# Patient Record
Sex: Male | Born: 1990 | Race: Black or African American | Hispanic: No | Marital: Single | Smoking: Current every day smoker
Health system: Southern US, Community
[De-identification: ages and names within clinical notes are randomized; demographics above are authoritative.]

## PROBLEM LIST (undated history)

## (undated) HISTORY — PX: APPENDECTOMY: SHX54

## (undated) HISTORY — PX: SPLENECTOMY, TOTAL: SHX788

---
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
## 2019-08-01 ENCOUNTER — Encounter (HOSPITAL_BASED_OUTPATIENT_CLINIC_OR_DEPARTMENT_OTHER): Payer: Self-pay | Admitting: Emergency Medicine

## 2019-08-01 ENCOUNTER — Other Ambulatory Visit: Payer: Self-pay

## 2019-08-01 ENCOUNTER — Emergency Department (HOSPITAL_BASED_OUTPATIENT_CLINIC_OR_DEPARTMENT_OTHER): Payer: Self-pay

## 2019-08-01 ENCOUNTER — Inpatient Hospital Stay (HOSPITAL_BASED_OUTPATIENT_CLINIC_OR_DEPARTMENT_OTHER)
Admission: EM | Admit: 2019-08-01 | Discharge: 2019-08-06 | DRG: 442 | Disposition: A | Discharge status: Home / Self Care | Payer: Self-pay | Attending: Internal Medicine | Admitting: Internal Medicine

## 2019-08-01 DIAGNOSIS — F17200 Nicotine dependence, unspecified, uncomplicated: Secondary | ICD-10-CM | POA: Diagnosis present

## 2019-08-01 DIAGNOSIS — Z856 Personal history of leukemia: Secondary | ICD-10-CM

## 2019-08-01 DIAGNOSIS — X58XXXA Exposure to other specified factors, initial encounter: Secondary | ICD-10-CM | POA: Diagnosis present

## 2019-08-01 DIAGNOSIS — Z9081 Acquired absence of spleen: Secondary | ICD-10-CM

## 2019-08-01 DIAGNOSIS — F1220 Cannabis dependence, uncomplicated: Secondary | ICD-10-CM | POA: Diagnosis present

## 2019-08-01 DIAGNOSIS — B159 Hepatitis A without hepatic coma: Principal | ICD-10-CM | POA: Diagnosis present

## 2019-08-01 DIAGNOSIS — D693 Immune thrombocytopenic purpura: Secondary | ICD-10-CM | POA: Diagnosis present

## 2019-08-01 DIAGNOSIS — T380X5A Adverse effect of glucocorticoids and synthetic analogues, initial encounter: Secondary | ICD-10-CM | POA: Diagnosis not present

## 2019-08-01 DIAGNOSIS — D72828 Other elevated white blood cell count: Secondary | ICD-10-CM | POA: Diagnosis not present

## 2019-08-01 DIAGNOSIS — Z23 Encounter for immunization: Secondary | ICD-10-CM

## 2019-08-01 DIAGNOSIS — Z20822 Contact with and (suspected) exposure to covid-19: Secondary | ICD-10-CM | POA: Diagnosis present

## 2019-08-01 DIAGNOSIS — Z9049 Acquired absence of other specified parts of digestive tract: Secondary | ICD-10-CM

## 2019-08-01 DIAGNOSIS — S025XXA Fracture of tooth (traumatic), initial encounter for closed fracture: Secondary | ICD-10-CM | POA: Diagnosis present

## 2019-08-01 DIAGNOSIS — D696 Thrombocytopenia, unspecified: Secondary | ICD-10-CM

## 2019-08-01 DIAGNOSIS — R7989 Other specified abnormal findings of blood chemistry: Secondary | ICD-10-CM

## 2019-08-01 DIAGNOSIS — Z96662 Presence of left artificial ankle joint: Secondary | ICD-10-CM | POA: Diagnosis present

## 2019-08-01 DIAGNOSIS — B179 Acute viral hepatitis, unspecified: Secondary | ICD-10-CM

## 2019-08-01 DIAGNOSIS — Z789 Other specified health status: Secondary | ICD-10-CM

## 2019-08-01 DIAGNOSIS — Q8901 Asplenia (congenital): Secondary | ICD-10-CM

## 2019-08-01 DIAGNOSIS — E876 Hypokalemia: Secondary | ICD-10-CM | POA: Diagnosis present

## 2019-08-01 LAB — URINALYSIS, MICROSCOPIC (REFLEX)

## 2019-08-01 LAB — URINALYSIS, ROUTINE W REFLEX MICROSCOPIC
Glucose, UA: 100 mg/dL — AB
Ketones, ur: 15 mg/dL — AB
Leukocytes,Ua: NEGATIVE
Nitrite: NEGATIVE
Protein, ur: 100 mg/dL — AB
Specific Gravity, Urine: >1.03 — ABNORMAL HIGH (ref 1.005–1.030)
pH: 6 (ref 5.0–8.0)

## 2019-08-01 LAB — CBC WITH DIFFERENTIAL/PLATELET
Abs Immature Granulocytes: 0.01 10*3/uL (ref 0.00–0.07)
Basophils Absolute: 0 10*3/uL (ref 0.0–0.1)
Basophils Relative: 1 %
Eosinophils Absolute: 0 10*3/uL (ref 0.0–0.5)
Eosinophils Relative: 0 %
HCT: 42.6 % (ref 39.0–52.0)
Hemoglobin: 14.7 g/dL (ref 13.0–17.0)
Immature Granulocytes: 0 %
Lymphocytes Relative: 44 %
Lymphs Abs: 2.4 10*3/uL (ref 0.7–4.0)
MCH: 32.2 pg (ref 26.0–34.0)
MCHC: 34.5 g/dL (ref 30.0–36.0)
MCV: 93.4 fL (ref 80.0–100.0)
Monocytes Absolute: 1.2 10*3/uL — ABNORMAL HIGH (ref 0.1–1.0)
Monocytes Relative: 23 %
Neutro Abs: 1.7 10*3/uL (ref 1.7–7.7)
Neutrophils Relative %: 32 %
Platelets: DECREASED 10*3/uL (ref 150–400)
RBC: 4.56 MIL/uL (ref 4.22–5.81)
RDW: 12.1 % (ref 11.5–15.5)
WBC: 5.3 10*3/uL (ref 4.0–10.5)
nRBC: 0 % (ref 0.0–0.2)

## 2019-08-01 LAB — COMPREHENSIVE METABOLIC PANEL
ALT: 2201 U/L — ABNORMAL HIGH (ref 0–44)
AST: 2067 U/L — ABNORMAL HIGH (ref 15–41)
Albumin: 3.7 g/dL (ref 3.5–5.0)
Alkaline Phosphatase: 100 U/L (ref 38–126)
Anion gap: 8 (ref 5–15)
BUN: 8 mg/dL (ref 6–20)
CO2: 24 mmol/L (ref 22–32)
Calcium: 8.4 mg/dL — ABNORMAL LOW (ref 8.9–10.3)
Chloride: 102 mmol/L (ref 98–111)
Creatinine, Ser: 0.73 mg/dL (ref 0.61–1.24)
GFR calc Af Amer: >60 mL/min (ref >60)
GFR calc non Af Amer: >60 mL/min (ref >60)
Glucose, Bld: 94 mg/dL (ref 70–99)
Potassium: 3.3 mmol/L — ABNORMAL LOW (ref 3.5–5.1)
Sodium: 134 mmol/L — ABNORMAL LOW (ref 135–145)
Total Bilirubin: 4.1 mg/dL — ABNORMAL HIGH (ref 0.3–1.2)
Total Protein: 7 g/dL (ref 6.5–8.1)

## 2019-08-01 LAB — ACETAMINOPHEN LEVEL: Acetaminophen (Tylenol), Serum: <10 ug/mL — ABNORMAL LOW (ref 10–30)

## 2019-08-01 LAB — AMMONIA: Ammonia: 31 umol/L (ref 9–35)

## 2019-08-01 LAB — PROTIME-INR
INR: 1.5 — ABNORMAL HIGH (ref 0.8–1.2)
Prothrombin Time: 18.4 seconds — ABNORMAL HIGH (ref 11.4–15.2)

## 2019-08-01 LAB — LIPASE, BLOOD: Lipase: 26 U/L (ref 11–51)

## 2019-08-01 NOTE — ED Triage Notes (Signed)
abd pain and body aches x 3 days. Denies N/V
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
## 2019-08-01 NOTE — ED Notes (Signed)
Patient transported to Ultrasound 

## 2019-08-02 ENCOUNTER — Encounter (HOSPITAL_BASED_OUTPATIENT_CLINIC_OR_DEPARTMENT_OTHER): Payer: Self-pay | Admitting: Emergency Medicine

## 2019-08-02 DIAGNOSIS — B159 Hepatitis A without hepatic coma: Secondary | ICD-10-CM | POA: Diagnosis present

## 2019-08-02 DIAGNOSIS — Z789 Other specified health status: Secondary | ICD-10-CM

## 2019-08-02 DIAGNOSIS — Q8901 Asplenia (congenital): Secondary | ICD-10-CM

## 2019-08-02 DIAGNOSIS — F17200 Nicotine dependence, unspecified, uncomplicated: Secondary | ICD-10-CM

## 2019-08-02 DIAGNOSIS — F1220 Cannabis dependence, uncomplicated: Secondary | ICD-10-CM

## 2019-08-02 LAB — RAPID URINE DRUG SCREEN, HOSP PERFORMED
Amphetamines: NOT DETECTED
Barbiturates: NOT DETECTED
Benzodiazepines: NOT DETECTED
Cocaine: NOT DETECTED
Opiates: NOT DETECTED
Tetrahydrocannabinol: POSITIVE — AB

## 2019-08-02 LAB — CBC WITH DIFFERENTIAL/PLATELET
Abs Immature Granulocytes: 0.02 10*3/uL (ref 0.00–0.07)
Basophils Absolute: 0 10*3/uL (ref 0.0–0.1)
Basophils Relative: 1 %
Eosinophils Absolute: 0 10*3/uL (ref 0.0–0.5)
Eosinophils Relative: 0 %
HCT: 43.6 % (ref 39.0–52.0)
Hemoglobin: 15.2 g/dL (ref 13.0–17.0)
Immature Granulocytes: 0 %
Lymphocytes Relative: 41 %
Lymphs Abs: 2.5 10*3/uL (ref 0.7–4.0)
MCH: 32.6 pg (ref 26.0–34.0)
MCHC: 34.9 g/dL (ref 30.0–36.0)
MCV: 93.6 fL (ref 80.0–100.0)
Monocytes Absolute: 1.2 10*3/uL — ABNORMAL HIGH (ref 0.1–1.0)
Monocytes Relative: 19 %
Neutro Abs: 2.3 10*3/uL (ref 1.7–7.7)
Neutrophils Relative %: 39 %
Platelets: DECREASED 10*3/uL (ref 150–400)
RBC: 4.66 MIL/uL (ref 4.22–5.81)
RDW: 12 % (ref 11.5–15.5)
WBC: 6 10*3/uL (ref 4.0–10.5)
nRBC: 0 % (ref 0.0–0.2)

## 2019-08-02 LAB — URINALYSIS, ROUTINE W REFLEX MICROSCOPIC
Bacteria, UA: NONE SEEN
Glucose, UA: NEGATIVE mg/dL
Ketones, ur: 20 mg/dL — AB
Leukocytes,Ua: NEGATIVE
Nitrite: NEGATIVE
Protein, ur: 100 mg/dL — AB
Specific Gravity, Urine: 1.028 (ref 1.005–1.030)
pH: 6 (ref 5.0–8.0)

## 2019-08-02 LAB — HEPATITIS PANEL, ACUTE
HCV Ab: NONREACTIVE
Hep A IgM: REACTIVE — AB
Hep B C IgM: NONREACTIVE
Hepatitis B Surface Ag: NONREACTIVE

## 2019-08-02 LAB — SARS CORONAVIRUS 2 (TAT 6-24 HRS): SARS Coronavirus 2: NEGATIVE

## 2019-08-02 LAB — HIV ANTIBODY (ROUTINE TESTING W REFLEX): HIV Screen 4th Generation wRfx: NONREACTIVE

## 2019-08-02 LAB — RPR: RPR Ser Ql: NONREACTIVE

## 2019-08-02 MED ORDER — ONDANSETRON HCL 4 MG/2ML IJ SOLN
4.0000 mg | Freq: Four times a day (QID) | INTRAMUSCULAR | Status: DC | PRN
  Administered 2019-08-02: 4 mg via INTRAVENOUS
  Filled 2019-08-02: qty 2

## 2019-08-02 MED ORDER — DOCUSATE SODIUM 100 MG PO CAPS
100.0000 mg | ORAL_CAPSULE | Freq: Two times a day (BID) | ORAL | Status: DC
  Administered 2019-08-02 – 2019-08-04 (×5): 100 mg via ORAL
  Filled 2019-08-02 (×8): qty 1

## 2019-08-02 MED ORDER — PNEUMOCOCCAL VAC POLYVALENT 25 MCG/0.5ML IJ INJ
0.5000 mL | INJECTION | INTRAMUSCULAR | Status: AC
  Administered 2019-08-03: 0.5 mL via INTRAMUSCULAR
  Filled 2019-08-02 (×2): qty 0.5

## 2019-08-02 MED ORDER — MENINGOCOCCAL A C Y&W-135 OLIG IM SOLR
0.5000 mL | Freq: Once | INTRAMUSCULAR | Status: DC
  Filled 2019-08-02 (×2): qty 0.5

## 2019-08-02 MED ORDER — ONDANSETRON HCL 4 MG PO TABS: 4.0000 mg | ORAL_TABLET | Freq: Four times a day (QID) | ORAL | Status: DC | PRN

## 2019-08-02 MED ORDER — LACTATED RINGERS IV SOLN: INTRAVENOUS | Status: DC

## 2019-08-02 MED ORDER — SODIUM CHLORIDE 0.9 % IV SOLN
1.0000 g | Freq: Once | INTRAVENOUS | Status: AC
  Administered 2019-08-02: 08:00:00 1 g via INTRAVENOUS
  Filled 2019-08-02: qty 10

## 2019-08-02 MED ORDER — INFLUENZA VAC SPLIT QUAD 0.5 ML IM SUSY
0.5000 mL | PREFILLED_SYRINGE | INTRAMUSCULAR | Status: AC
  Administered 2019-08-03: 0.5 mL via INTRAMUSCULAR

## 2019-08-02 NOTE — ED Notes (Signed)
Patient states sweaty; gown and linens changed; denies pain or NV.

## 2019-08-02 NOTE — ED Notes (Signed)
Gave patient gatorade for po challenge.

## 2019-08-02 NOTE — ED Provider Notes (Signed)
Pt holding in [facility] ED pending admission for acute hepatitis, has hx/o splencetomy.  UA with many bacteria present, will send culture and treat with abx pending further work up. On bedside evaluation patient is sleeping comfortably in no acute distress.   Quintella Reichert, MD 08/02/19 [phone]
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
## 2019-08-03 LAB — CBC
HCT: 40.9 % (ref 39.0–52.0)
HCT: 41.3 % (ref 39.0–52.0)
Hemoglobin: 14.2 g/dL (ref 13.0–17.0)
Hemoglobin: 14.4 g/dL (ref 13.0–17.0)
MCH: 32.8 pg (ref 26.0–34.0)
MCH: 32.7 pg (ref 26.0–34.0)
MCHC: 34.7 g/dL (ref 30.0–36.0)
MCHC: 34.9 g/dL (ref 30.0–36.0)
MCV: 94.5 fL (ref 80.0–100.0)
MCV: 93.9 fL (ref 80.0–100.0)
Platelets: 19 10*3/uL — CL (ref 150–400)
Platelets: 30 10*3/uL — ABNORMAL LOW (ref 150–400)
RBC: 4.33 MIL/uL (ref 4.22–5.81)
RBC: 4.4 MIL/uL (ref 4.22–5.81)
RDW: 12.1 % (ref 11.5–15.5)
RDW: 12 % (ref 11.5–15.5)
WBC: 6.5 10*3/uL (ref 4.0–10.5)
WBC: 6.6 10*3/uL (ref 4.0–10.5)
nRBC: 0 % (ref 0.0–0.2)

## 2019-08-03 LAB — COMPREHENSIVE METABOLIC PANEL
ALT: 3206 U/L — ABNORMAL HIGH (ref 0–44)
AST: 2946 U/L — ABNORMAL HIGH (ref 15–41)
Albumin: 3.2 g/dL — ABNORMAL LOW (ref 3.5–5.0)
Alkaline Phosphatase: 95 U/L (ref 38–126)
Anion gap: 7 (ref 5–15)
BUN: 7 mg/dL (ref 6–20)
CO2: 26 mmol/L (ref 22–32)
Calcium: 8.4 mg/dL — ABNORMAL LOW (ref 8.9–10.3)
Chloride: 102 mmol/L (ref 98–111)
Creatinine, Ser: 0.62 mg/dL (ref 0.61–1.24)
GFR calc Af Amer: >60 mL/min (ref >60)
GFR calc non Af Amer: >60 mL/min (ref >60)
Glucose, Bld: 89 mg/dL (ref 70–99)
Potassium: 3.6 mmol/L (ref 3.5–5.1)
Sodium: 135 mmol/L (ref 135–145)
Total Bilirubin: 7.7 mg/dL — ABNORMAL HIGH (ref 0.3–1.2)
Total Protein: 6.3 g/dL — ABNORMAL LOW (ref 6.5–8.1)

## 2019-08-03 LAB — URINE CULTURE: Culture: <10000 — AB

## 2019-08-03 LAB — PATHOLOGIST SMEAR REVIEW

## 2019-08-03 MED ORDER — PREDNISONE 50 MG PO TABS
60.0000 mg | ORAL_TABLET | Freq: Every day | ORAL | Status: DC
  Administered 2019-08-04 – 2019-08-06 (×3): 60 mg via ORAL
  Filled 2019-08-03 (×3): qty 1

## 2019-08-03 NOTE — Progress Notes (Signed)
PROGRESS NOTE    Todd Marshall  [name] DOB: Oct 29, 1990 DOA: 08/01/2019 PCP: Patient, No Pcp Per   Brief Narrative: Todd Marshall is a 29 y.o. male with medical history significant of ITP requiring  splenectomy and childhood leukemia  presenting with abdominal pain x 3 days.   He reports that he ate something from [facility] and ever since  his stomach has been acting crazy.  He ate mozzarella sticks but he thought it tasted like the grease , he gagged immediately.  That was Monday.  He developed midepigastric abdominal pain that night and was constant until he went to the ER last night.  He was having cold sweats and having difficulty eating.  No sick contacts. Last BM was Monday.  He recently got out of [facility] from 8/5-12/5 for drugs. He had a tattoo done a week or two ago; his brother did the tattoo and did it in his home with his own equipment.  He smokes marijuana but no other drugs. He denies any active bleeding, He has not followed up with hematologist after his splenectomy.  He is found to have significantly elevated AST ALT consistent with hepatitis A infection.  Assessment & Plan:   Principal Problem:   Hepatitis A infection Active Problems:   Marijuana dependence ([place])   Tobacco dependence   History of incarceration   Asplenia  Hepatitis A Acute Infection: -Patient thinks he was infected after eating at the [employer] ([place], [place] [employer] -He developed acute n/v which resolved and then he had abdominal pain for several days -Markedly elevated LFTs -Treatment is generally supportive care only with IVF and monitoring to ensure spontaneous improvement. -Patient is complicated because he is asplenic, but there does not appear to be anything that we need to do differently in this circumstance -With his asplenia, he is at increased risk for encapsulated organism infections and so he should be vaccinated against Pneumococcus and  Meningococcus (flu shot also ordered as per protocol) -If LFTs begin to downtrend, he may be appropriate for d/c to home tomorrow (assuming he can tolerate PO) -Hep B/C, RPR, HIV all negative -Avoid Tylenol, other hepatotoxic medications.  H/o incarceration: -We discussed the importance of making better life choices -He specifically denies IVDA or other drug use   Marijuana dependence -Cessation encouraged; this should be encouraged on an ongoing basis -UDS + for marijuana.  Tobacco dependence -Encourage cessation.   -This was discussed with the patient and should be reviewed on an ongoing basis.   -Patch declined by patient  Clumped platelets/ Thrombocytopenia /ITP: -Last known platelet count is 87 in 2019 -Repeat CBC in AM shows platelets 19.0 -Case discussed with Dr. Benay Spice, he suggest repeat CBC in citrate bottle,  -Consider prednisone 60 mg daily for now. He is not actively bleeding, no need for platelet transfusion at this time. -Will hold Lovenox for now and encourage early ambulation   Note: This patient has been tested and is negative for the novel coronavirus COVID-19.  DVT prophylaxis: Early ambulation Code Status: Full code Family Communication: Discussed with patient in detail Disposition Plan: Remain inpatient Consultants:    Heme-onc consult  Procedures: None. Antimicrobials: Anti-infectives (From admission, onward)   Start     Dose/Rate Route Frequency Ordered Stop   08/02/19 0715  cefTRIAXone (ROCEPHIN) 1 g in sodium chloride 0.9 % 100 mL IVPB     1 g 200 mL/hr over 30 Minutes Intravenous  Once 08/02/19 0712 08/02/19 0902     Subjective:  Patient was seen and examined at bedside, He denies any bleeding.  States he has not followed up with hematologist after having the splenectomy.  He reports abdominal pain,  nausea and vomiting has resolved.   Objective: Vitals:   08/02/19 1141 08/02/19 1253 08/02/19 2018 08/03/19 0517  BP: 109/60 119/84  120/84 117/85  Pulse: 65 (!) 51 (!) 56 (!) 47  Resp: 16 16 16 16   Temp: 98.3 F (36.8 C) 98.6 F (37 C) 98.4 F (36.9 C) 98.6 F (37 C)  TempSrc: Oral Oral    SpO2: 100% 100% 100% 98%  Weight:      Height:        Intake/Output Summary (Last 24 hours) at 08/03/2019 1259 Last data filed at 08/03/2019 0905 Gross per 24 hour  Intake 2084.7 ml  Output 725 ml  Net 1359.7 ml   Filed Weights   08/01/19 1951  Weight: 72.6 kg    Examination:  General exam: Appears calm and comfortable  Respiratory system: Clear to auscultation. Respiratory effort normal. Cardiovascular system: S1 & S2 heard, RRR. No JVD, murmurs, rubs, gallops or clicks. No pedal edema. Gastrointestinal system: Abdomen is nondistended, soft and nontender. No organomegaly or masses felt. Normal bowel sounds heard. Central nervous system: Alert and oriented. No focal neurological deficits. Extremities: No swelling, no tenderness Skin: No rashes, lesions or ulcers Psychiatry: Judgement and insight appear normal. Mood & affect appropriate.   Data Reviewed: I have personally reviewed following labs and imaging studies  CBC: Recent Labs  Lab 08/01/19 2123 08/02/19 0018 08/03/19 0608  WBC 5.3 6.0 6.5  NEUTROABS 1.7 2.3  --   HGB 14.7 15.2 14.2  HCT 42.6 43.6 40.9  MCV 93.4 93.6 94.5  PLT PLATELET CLUMPS NOTED ON SMEAR, COUNT APPEARS DECREASED PLATELET CLUMPS NOTED ON SMEAR, COUNT APPEARS DECREASED 19*   Basic Metabolic Panel: Recent Labs  Lab 08/01/19 2018 08/03/19 0608  NA 134* 135  K 3.3* 3.6  CL 102 102  CO2 24 26  GLUCOSE 94 89  BUN 8 7  CREATININE 0.73 0.62  CALCIUM 8.4* 8.4*   GFR: Estimated Creatinine Clearance: 139.9 mL/min (by C-G formula based on SCr of 0.62 mg/dL). Liver Function Tests: Recent Labs  Lab 08/01/19 2018 08/03/19 0608  AST 2,067* 2,946*  ALT 2,201* 3,206*  ALKPHOS 100 95  BILITOT 4.1* 7.7*  PROT 7.0 6.3*  ALBUMIN 3.7 3.2*   Recent Labs  Lab 08/01/19 2228  LIPASE 26    Recent Labs  Lab 08/01/19 2228  AMMONIA 31   Coagulation Profile: Recent Labs  Lab 08/01/19 2228  INR 1.5*   Cardiac Enzymes: No results for input(s): CKTOTAL, CKMB, CKMBINDEX, TROPONINI in the last 168 hours. BNP (last 3 results) No results for input(s): PROBNP in the last 8760 hours. HbA1C: No results for input(s): HGBA1C in the last 72 hours. CBG: No results for input(s): GLUCAP in the last 168 hours. Lipid Profile: No results for input(s): CHOL, HDL, LDLCALC, TRIG, CHOLHDL, LDLDIRECT in the last 72 hours. Thyroid Function Tests: No results for input(s): TSH, T4TOTAL, FREET4, T3FREE, THYROIDAB in the last 72 hours. Anemia Panel: No results for input(s): VITAMINB12, FOLATE, FERRITIN, TIBC, IRON, RETICCTPCT in the last 72 hours. Sepsis Labs: No results for input(s): PROCALCITON, LATICACIDVEN in the last 168 hours.  Recent Results (from the past 240 hour(s))  Urine culture     Status: Abnormal   Collection Time: 08/01/19  8:19 PM   Specimen: Urine, Random  Result Value Ref Range Status  Specimen Description   Final    URINE, RANDOM Performed at [facility], [address] [place], [place] [address]    Special Requests   Final    NONE Performed at [facility], [address] [place], [place] [address]    Culture (A)  Final    <10,000 COLONIES/mL INSIGNIFICANT GROWTH Performed at [facility] [address] [place], [place] [address]    Report Status 08/03/2019 FINAL  Final  SARS CORONAVIRUS 2 (TAT 6-24 HRS) Nasopharyngeal Nasopharyngeal Swab     Status: None   Collection Time: 08/01/19  9:23 PM   Specimen: Nasopharyngeal Swab  Result Value Ref Range Status   SARS Coronavirus 2 NEGATIVE NEGATIVE Final    Comment: (NOTE) SARS-CoV-2 target nucleic acids are NOT DETECTED. The SARS-CoV-2 RNA is generally detectable in upper and lower respiratory specimens during the acute phase of infection. Negative results do not preclude  SARS-CoV-2 infection, do not rule out co-infections with other pathogens, and should not be used as the sole basis for treatment or other patient management decisions. Negative results must be combined with clinical observations, patient history, and epidemiological information. The expected result is Negative. Fact Sheet for Patients: SugarRoll.be Fact Sheet for Healthcare Providers: https://www.woods-mathews.com/ This test is not yet approved or cleared by the [place] FDA and  has been authorized for detection and/or diagnosis of SARS-CoV-2 by FDA under an Emergency Use Authorization (EUA). This EUA will remain  in effect (meaning this test can be used) for the duration of the COVID-19 declaration under Section 56 4(b)(1) of the Act, 21 U.S.C. section 360bbb-3(b)(1), unless the authorization is terminated or revoked sooner. Performed at [facility], [place] [address] [place], [place] [address]       Radiology Studies: US Abdomen Limited RUQ  Result Date: 08/01/2019 CLINICAL DATA:  Elevated LFTs EXAM: ULTRASOUND ABDOMEN LIMITED RIGHT UPPER QUADRANT COMPARISON:  None. FINDINGS: Gallbladder: No gallstones or wall thickening visualized. No sonographic Murphy sign noted by sonographer. Common bile duct: Diameter: 2.6 mm Liver: No focal lesion identified. Within normal limits in parenchymal echogenicity. Portal vein is patent on color Doppler imaging with normal direction of blood flow towards the liver. Other: Small 5 mm porta hepatis lymph node is noted. IMPRESSION: No acute abnormality noted. Electronically Signed   By: Inez Catalina M.D.   On: 08/01/2019 22:28   Scheduled Meds: . docusate sodium  100 mg Oral BID  . meningococcal oligosaccharide  0.5 mL Intramuscular Once   Continuous Infusions: . lactated ringers 125 mL/hr at 08/03/19 0905     LOS: 1 day    Time spent: [place], MD [facility]   If  7PM-7AM, please contact night-coverage

## 2019-08-04 LAB — COMPREHENSIVE METABOLIC PANEL
ALT: 3828 U/L — ABNORMAL HIGH (ref 0–44)
AST: 3464 U/L — ABNORMAL HIGH (ref 15–41)
Albumin: 3.2 g/dL — ABNORMAL LOW (ref 3.5–5.0)
Alkaline Phosphatase: 113 U/L (ref 38–126)
Anion gap: 8 (ref 5–15)
BUN: 6 mg/dL (ref 6–20)
CO2: 24 mmol/L (ref 22–32)
Calcium: 8.5 mg/dL — ABNORMAL LOW (ref 8.9–10.3)
Chloride: 102 mmol/L (ref 98–111)
Creatinine, Ser: 0.46 mg/dL — ABNORMAL LOW (ref 0.61–1.24)
GFR calc Af Amer: >60 mL/min (ref >60)
GFR calc non Af Amer: >60 mL/min (ref >60)
Glucose, Bld: 78 mg/dL (ref 70–99)
Potassium: 3.8 mmol/L (ref 3.5–5.1)
Sodium: 134 mmol/L — ABNORMAL LOW (ref 135–145)
Total Bilirubin: 9.9 mg/dL — ABNORMAL HIGH (ref 0.3–1.2)
Total Protein: 6.4 g/dL — ABNORMAL LOW (ref 6.5–8.1)

## 2019-08-04 LAB — PLATELET BY CITRATE

## 2019-08-04 LAB — CBC
HCT: 41.1 % (ref 39.0–52.0)
Hemoglobin: 14.1 g/dL (ref 13.0–17.0)
MCH: 32.3 pg (ref 26.0–34.0)
MCHC: 34.3 g/dL (ref 30.0–36.0)
MCV: 94.3 fL (ref 80.0–100.0)
RBC: 4.36 MIL/uL (ref 4.22–5.81)
RDW: 12.2 % (ref 11.5–15.5)
WBC: 9.3 10*3/uL (ref 4.0–10.5)
nRBC: 0 % (ref 0.0–0.2)

## 2019-08-04 LAB — PHOSPHORUS: Phosphorus: 2.5 mg/dL (ref 2.5–4.6)

## 2019-08-04 LAB — MAGNESIUM: Magnesium: 1.8 mg/dL (ref 1.7–2.4)

## 2019-08-04 MED ORDER — OXYCODONE HCL 5 MG PO TABS
5.0000 mg | ORAL_TABLET | Freq: Once | ORAL | Status: AC
  Administered 2019-08-04: 5 mg via ORAL
  Filled 2019-08-04: qty 1

## 2019-08-04 MED ORDER — ALUM & MAG HYDROXIDE-SIMETH 200-200-20 MG/5ML PO SUSP
30.0000 mL | Freq: Four times a day (QID) | ORAL | Status: DC | PRN
  Administered 2019-08-04: 30 mL via ORAL
  Filled 2019-08-04: qty 30

## 2019-08-04 NOTE — Progress Notes (Signed)
Due to pain in right shoulder as a result of PNA vaccine administration on 08/03/2019, patient refused to take MENVEO vaccine. Attending physician and pharmacy made aware.

## 2019-08-04 NOTE — Progress Notes (Signed)
PROGRESS NOTE    Todd Marshall  [name] DOB: Jul 21, 1991 DOA: 08/01/2019 PCP: Patient, No Pcp Per   Brief Narrative: Todd Marshall is a 29 y.o. male with medical history significant of ITP requiring  splenectomy and childhood leukemia  presenting with abdominal pain x 3 days.   He reports that he ate something from [facility] and ever since  his stomach has been acting crazy.  He ate mozzarella sticks but he thought it tasted like the grease , he gagged immediately.  That was Monday.  He developed midepigastric abdominal pain that night and was constant until he went to the ER last night.  He was having cold sweats and having difficulty eating.  No sick contacts. Last BM was Monday.  He recently got out of [facility] from 8/5-12/5 for drugs. He had a tattoo done a week or two ago; his brother did the tattoo and did it in his home with his own equipment.  He smokes marijuana but no other drugs. He denies any active bleeding, He has not followed up with hematologist after his splenectomy. He is found to have significantly elevated AST ALT consistent with hepatitis A infection. GI consulted for worsening Liver enzymes. Hematology suggest prednisone therapy.  Assessment & Plan:   Principal Problem:   Hepatitis A infection Active Problems:   Marijuana dependence ([place])   Tobacco dependence   History of incarceration   Asplenia  Hepatitis A Acute Infection: -Patient thinks he was infected after eating at the [facility] ([place], [place]) [employer] -He developed acute n/v which resolved and then he had abdominal pain for several days -Treatment is generally supportive care only with IVF and monitoring to ensure spontaneous improvement. -Patient is complicated because he is asplenic, but there does not appear to be anything that we need to do differently in this circumstance -With his asplenia, he is at increased risk for encapsulated organism infections and so he  should be vaccinated against Pneumococcus and Meningococcus (flu shot also ordered as per protocol) -If LFTs begin to downtrend, he may be appropriate for d/c to home tomorrow (assuming he can tolerate PO) -Hep B/C, RPR, HIV all negative -Avoid Tylenol, other hepatotoxic medications. -Markedly elevated LFTs, trending up  GI consulted, will follow up recommendations.  H/o incarceration: -We discussed the importance of making better life choices -He specifically denies IVDA or other drug use   Marijuana dependence -Cessation encouraged; this should be encouraged on an ongoing basis -UDS + for marijuana.  Tobacco dependence -Encourage cessation.   -This was discussed with the patient and should be reviewed on an ongoing basis.   -Patch declined by patient  Clumped platelets/ Thrombocytopenia /ITP: -Last known platelet count is 87 in 2019 -Repeat CBC in AM shows platelets 19.0 - 41 -Case discussed with Dr. Benay Spice, he suggest repeat CBC in citrate bottle,  -Consider prednisone 60 mg daily for now. He is not actively bleeding, no need for platelet transfusion at this time. -Will hold Lovenox for now and encourage early ambulation   Note: This patient has been tested and is negative for the novel coronavirus COVID-19.  DVT prophylaxis: Early ambulation Code Status: Full code Family Communication: Discussed with patient in detail Disposition Plan: Remain inpatient Consultants:    Heme-onc consult  Gastroenterology  Procedures: None. Antimicrobials: Anti-infectives (From admission, onward)   Start     Dose/Rate Route Frequency Ordered Stop   08/02/19 0715  cefTRIAXone (ROCEPHIN) 1 g in sodium chloride 0.9 % 100 mL IVPB  1 g 200 mL/hr over 30 Minutes Intravenous  Once 08/02/19 [id number] 08/02/19 0902     Subjective: Patient was seen and examined at bedside, He denies any bleeding.   He reports abdominal pain,  nausea and vomiting has resolved.   Objective: Vitals:    08/03/19 1440 08/03/19 2055 08/04/19 0535 08/04/19 1423  BP: 124/77 131/78 (!) 142/88 122/80  Pulse: (!) 51 (!) 53 63 (!) 55  Resp: 18 16 18 18   Temp: 98.6 F (37 C) 99 F (37.2 C) 99.5 F (37.5 C) 98.5 F (36.9 C)  TempSrc:  Oral Oral Oral  SpO2: 100% 100% 92% 99%  Weight:      Height:        Intake/Output Summary (Last 24 hours) at 08/04/2019 1434 Last data filed at 08/04/2019 0800 Gross per 24 hour  Intake --  Output 1125 ml  Net -1125 ml   Filed Weights   08/01/19 1951  Weight: 72.6 kg    Examination:  General exam: Appears calm and comfortable  Respiratory system: Clear to auscultation. Respiratory effort normal. Cardiovascular system: S1 & S2 heard, RRR. No JVD, murmurs, rubs, gallops or clicks. No pedal edema. Gastrointestinal system: Abdomen is nondistended, soft and nontender. No organomegaly or masses felt. Normal bowel sounds heard. Central nervous system: Alert and oriented. No focal neurological deficits. Extremities: No swelling, no tenderness Skin: No rashes, lesions or ulcers Psychiatry: Judgement and insight appear normal. Mood & affect appropriate.   Data Reviewed: I have personally reviewed following labs and imaging studies  CBC: Recent Labs  Lab 08/01/19 2123 08/02/19 0018 08/03/19 0608 08/03/19 0900 08/04/19 0559  WBC 5.3 6.0 6.5 6.6 9.3  NEUTROABS 1.7 2.3  --   --   --   HGB 14.7 15.2 14.2 14.4 14.1  HCT 42.6 43.6 40.9 41.3 41.1  MCV 93.4 93.6 94.5 93.9 94.3  PLT PLATELET CLUMPS NOTED ON SMEAR, COUNT APPEARS DECREASED PLATELET CLUMPS NOTED ON SMEAR, COUNT APPEARS DECREASED 19* 30* PLATELET COUNT PERFORMED ON CITRATED BLOOD   Basic Metabolic Panel: Recent Labs  Lab 08/01/19 2018 08/03/19 0608 08/04/19 0559  NA 134* 135 134*  K 3.3* 3.6 3.8  CL 102 102 102  CO2 24 26 24   GLUCOSE 94 89 78  BUN 8 7 6   CREATININE 0.73 0.62 0.46*  CALCIUM 8.4* 8.4* 8.5*  MG  --   --  1.8  PHOS  --   --  2.5   GFR: Estimated Creatinine Clearance:  139.9 mL/min (A) (by C-G formula based on SCr of 0.46 mg/dL (L)). Liver Function Tests: Recent Labs  Lab 08/01/19 2018 08/03/19 0608 08/04/19 0559  AST 2,067* 2,946* 3,464*  ALT 2,201* 3,206* 3,828*  ALKPHOS 100 95 113  BILITOT 4.1* 7.7* 9.9*  PROT 7.0 6.3* 6.4*  ALBUMIN 3.7 3.2* 3.2*   Recent Labs  Lab 08/01/19 2228  LIPASE 26   Recent Labs  Lab 08/01/19 2228  AMMONIA 31   Coagulation Profile: Recent Labs  Lab 08/01/19 2228  INR 1.5*   Cardiac Enzymes: No results for input(s): CKTOTAL, CKMB, CKMBINDEX, TROPONINI in the last 168 hours. BNP (last 3 results) No results for input(s): PROBNP in the last 8760 hours. HbA1C: No results for input(s): HGBA1C in the last 72 hours. CBG: No results for input(s): GLUCAP in the last 168 hours. Lipid Profile: No results for input(s): CHOL, HDL, LDLCALC, TRIG, CHOLHDL, LDLDIRECT in the last 72 hours. Thyroid Function Tests: No results for input(s): TSH, T4TOTAL, FREET4, T3FREE, THYROIDAB in  the last 72 hours. Anemia Panel: No results for input(s): VITAMINB12, FOLATE, FERRITIN, TIBC, IRON, RETICCTPCT in the last 72 hours. Sepsis Labs: No results for input(s): PROCALCITON, LATICACIDVEN in the last 168 hours.  Recent Results (from the past 240 hour(s))  Urine culture     Status: Abnormal   Collection Time: 08/01/19  8:19 PM   Specimen: Urine, Random  Result Value Ref Range Status   Specimen Description   Final    URINE, RANDOM Performed at [facility], [address] [place], [place] [address]    Special Requests   Final    NONE Performed at [facility], [address] [place], [place] [address]    Culture (A)  Final    <10,000 COLONIES/mL INSIGNIFICANT GROWTH Performed at [facility], [place] [address], [place], [place] [address]    Report Status 08/03/2019 FINAL  Final  SARS CORONAVIRUS 2 (TAT 6-24 HRS) Nasopharyngeal Nasopharyngeal Swab     Status: None   Collection Time: 08/01/19  9:23  PM   Specimen: Nasopharyngeal Swab  Result Value Ref Range Status   SARS Coronavirus 2 NEGATIVE NEGATIVE Final    Comment: (NOTE) SARS-CoV-2 target nucleic acids are NOT DETECTED. The SARS-CoV-2 RNA is generally detectable in upper and lower respiratory specimens during the acute phase of infection. Negative results do not preclude SARS-CoV-2 infection, do not rule out co-infections with other pathogens, and should not be used as the sole basis for treatment or other patient management decisions. Negative results must be combined with clinical observations, patient history, and epidemiological information. The expected result is Negative. Fact Sheet for Patients: SugarRoll.be Fact Sheet for Healthcare Providers: https://www.woods-mathews.com/ This test is not yet approved or cleared by the [place] FDA and  has been authorized for detection and/or diagnosis of SARS-CoV-2 by FDA under an Emergency Use Authorization (EUA). This EUA will remain  in effect (meaning this test can be used) for the duration of the COVID-19 declaration under Section 56 4(b)(1) of the Act, 21 U.S.C. section 360bbb-3(b)(1), unless the authorization is terminated or revoked sooner. Performed at [facility], [place] [address] [place], [place] [address]       Radiology Studies: No results found. Scheduled Meds: . docusate sodium  100 mg Oral BID  . meningococcal oligosaccharide  0.5 mL Intramuscular Once  . predniSONE  60 mg Oral Q breakfast   Continuous Infusions: . lactated ringers 125 mL/hr at 08/04/19 0911     LOS: 2 days    Time spent: [place], MD [facility]   If 7PM-7AM, please contact night-coverage

## 2019-08-04 NOTE — Consult Note (Signed)
Referring Provider: Dr. Dwyane Dee Primary Care Physician:  Patient, No Pcp Per Primary Gastroenterologist:  Althia Forts  Reason for Consultation:  Hepatitis A; Jaundice  HPI: Todd Marshall is a 29 y.o. male with newly diagnosed acute hepatitis A that he thinks he got from eating at [facility] the day before he started having abdominal pain, nausea/vomiting. He denies previous hepatitis infections. Hep A IgM Ab positive and Hep B and Hep C negative.  On admit (08/01/19) AST 2,067; ALT 2,201; TB 4.1, ALP 100, INR 1.5. LFTs have risen further to AST 3,464; ALT 3,828, TB 9.9, ALP 113.  History of ITP and previous splenectomy. Platelet count of 19. Denies alcohol in over a month. Occasional NSAIDs. Reports being incarcerated for 5 months until early December. Denies anal sex. Multiple tattoos all over his body. Mother at bedside.  Past Medical History:  Diagnosis Date  . Acute ITP ([place]) 2007  . Leukemia, acute ([place]) 2007   splenectomy    Past Surgical History:  Procedure Laterality Date  . ANKLE ARTHROPLASTY Left 2009  . APPENDECTOMY    . SPLENECTOMY, TOTAL      Prior to Admission medications   Medication Sig Start Date End Date Taking? Authorizing Provider  ibuprofen (ADVIL) 200 MG tablet Take 400 mg by mouth every 6 (six) hours as needed for moderate pain.   Yes [provider]    Scheduled Meds: . docusate sodium  100 mg Oral BID  . meningococcal oligosaccharide  0.5 mL Intramuscular Once  . predniSONE  60 mg Oral Q breakfast   Continuous Infusions: . lactated ringers 125 mL/hr at 08/04/19 0911   PRN Meds:.ondansetron **OR** ondansetron (ZOFRAN) IV  Allergies as of 08/01/2019  . (No Known Allergies)    History reviewed. No pertinent family history.  Social History   Socioeconomic History  . Marital status: Single    Spouse name: Not on file  . Number of children: Not on file  . Years of education: Not on file  . Highest education level: Not on file  Occupational  History  . Occupation: unemployed  Tobacco Use  . Smoking status: Current Every Day Smoker    Packs/day: 1.50    Years: 15.00    Pack years: 22.50  . Smokeless tobacco: Never Used  Substance and Sexual Activity  . Alcohol use: Yes    Comment: little use  . Drug use: Not Currently    Types: Marijuana    Comment: daily use  . Sexual activity: Not on file  Other Topics Concern  . Not on file  Social History Narrative  . Not on file   Social Determinants of Health   Financial Resource Strain:   . Difficulty of Paying Living Expenses: Not on file  Food Insecurity:   . Worried About Charity fundraiser in the Last Year: Not on file  . Ran Out of Food in the Last Year: Not on file  Transportation Needs:   . Lack of Transportation (Medical): Not on file  . Lack of Transportation (Non-Medical): Not on file  Physical Activity:   . Days of Exercise per Week: Not on file  . Minutes of Exercise per Session: Not on file  Stress:   . Feeling of Stress : Not on file  Social Connections:   . Frequency of Communication with Friends and Family: Not on file  . Frequency of Social Gatherings with Friends and Family: Not on file  . Attends Religious Services: Not on file  . Active  Member of Clubs or Organizations: Not on file  . Attends Archivist Meetings: Not on file  . Marital Status: Not on file  Intimate Partner Violence:   . Fear of Current or Ex-Partner: Not on file  . Emotionally Abused: Not on file  . Physically Abused: Not on file  . Sexually Abused: Not on file    Review of Systems: All negative except as stated above in HPI.  Physical Exam: Vital signs: Vitals:   08/04/19 0535 08/04/19 1423  BP: (!) 142/88 122/80  Pulse: 63 (!) 55  Resp: 18 18  Temp: 99.5 F (37.5 C) 98.5 F (36.9 C)  SpO2: 92% 99%   Last BM Date: 07/29/19 General:   Alert,  Well-developed, well-nourished, pleasant and cooperative in NAD, multiple tattoos Head: normocephalic,  atraumatic Eyes: +icteric sclera ENT: oropharynx clear Neck: supple, nontender Lungs:  Clear throughout to auscultation.   No wheezes, crackles, or rhonchi. No acute distress. Heart:  Regular rate and rhythm; no murmurs, clicks, rubs,  or gallops. Abdomen: minimal upper quadrant tenderness with guarding, otherwise nontender, soft, nondistended, +BS  Rectal:  Deferred Ext: no edema  GI:  Lab Results: Recent Labs    08/03/19 0608 08/03/19 0900 08/04/19 0559  WBC 6.5 6.6 9.3  HGB 14.2 14.4 14.1  HCT 40.9 41.3 41.1  PLT 19* 30* PLATELET COUNT PERFORMED ON CITRATED BLOOD   BMET Recent Labs    08/01/19 2018 08/03/19 0608 08/04/19 0559  NA 134* 135 134*  K 3.3* 3.6 3.8  CL 102 102 102  CO2 24 26 24   GLUCOSE 94 89 78  BUN 8 7 6   CREATININE 0.73 0.62 0.46*  CALCIUM 8.4* 8.4* 8.5*   LFT Recent Labs    08/04/19 0559  PROT 6.4*  ALBUMIN 3.2*  AST 3,464*  ALT 3,828*  ALKPHOS 113  BILITOT 9.9*   PT/INR Recent Labs    08/01/19 2228  LABPROT 18.4*  INR 1.5*     Studies/Results: No results found.  Impression/Plan: Acute Hepatitis A without any signs of liver failure. I suspect his transaminases will plateau in the next 24-48 hours and when they start to trend down then ok to d/c home from a liver standpoint. Hematology f/u may be needed with his low platelets. Discussed how Hep A is transmitted (fecal-oral route). Continue supportive care. [facility] will f/u tomorrow.    LOS: 2 days   Lear Ng  08/04/2019, 2:28 PM  Questions please call [phone]

## 2019-08-04 NOTE — Progress Notes (Signed)
IVF resumed after patient took a shower. GI physician and mom are at bedside.

## 2019-08-05 LAB — COMPREHENSIVE METABOLIC PANEL
ALT: 3139 U/L — ABNORMAL HIGH (ref 0–44)
AST: 1805 U/L — ABNORMAL HIGH (ref 15–41)
Albumin: 2.9 g/dL — ABNORMAL LOW (ref 3.5–5.0)
Alkaline Phosphatase: 112 U/L (ref 38–126)
Anion gap: 10 (ref 5–15)
BUN: 7 mg/dL (ref 6–20)
CO2: 25 mmol/L (ref 22–32)
Calcium: 8.2 mg/dL — ABNORMAL LOW (ref 8.9–10.3)
Chloride: 102 mmol/L (ref 98–111)
Creatinine, Ser: 0.42 mg/dL — ABNORMAL LOW (ref 0.61–1.24)
GFR calc Af Amer: >60 mL/min (ref >60)
GFR calc non Af Amer: >60 mL/min (ref >60)
Glucose, Bld: 88 mg/dL (ref 70–99)
Potassium: 3.8 mmol/L (ref 3.5–5.1)
Sodium: 137 mmol/L (ref 135–145)
Total Bilirubin: 11.3 mg/dL — ABNORMAL HIGH (ref 0.3–1.2)
Total Protein: 6.1 g/dL — ABNORMAL LOW (ref 6.5–8.1)

## 2019-08-05 LAB — CBC
HCT: 40.2 % (ref 39.0–52.0)
Hemoglobin: 13.9 g/dL (ref 13.0–17.0)
MCH: 33 pg (ref 26.0–34.0)
MCHC: 34.6 g/dL (ref 30.0–36.0)
MCV: 95.5 fL (ref 80.0–100.0)
Platelets: DECREASED 10*3/uL (ref 150–400)
RBC: 4.21 MIL/uL — ABNORMAL LOW (ref 4.22–5.81)
RDW: 12.4 % (ref 11.5–15.5)
WBC: 13.4 10*3/uL — ABNORMAL HIGH (ref 4.0–10.5)
nRBC: 0 % (ref 0.0–0.2)

## 2019-08-05 NOTE — Progress Notes (Signed)
PROGRESS NOTE    Todd Marshall  [name] DOB: Jan 14, 1991 DOA: 08/01/2019 PCP: Patient, No Pcp Per   Brief Narrative: Todd Marshall is a 29 y.o. male with medical history significant of ITP requiring  splenectomy and childhood leukemia  presenting with abdominal pain x 3 days.   He reports that he ate something from [facility] and ever since  his stomach has been acting crazy.  He ate mozzarella sticks but he thought it tasted like the grease , he gagged immediately.  That was Monday.  He developed midepigastric abdominal pain that night and was constant until he went to the ER last night.  He was having cold sweats and having difficulty eating.  No sick contacts. Last BM was Monday.  He recently got out of [facility] from 8/5-12/5 for drugs. He had a tattoo done a week or two ago; his brother did the tattoo and did it in his home with his own equipment.  He smokes marijuana but no other drugs. He denies any active bleeding, He has not followed up with hematologist after his splenectomy. He is found to have significantly elevated AST ALT consistent with hepatitis A infection. GI consulted for worsening Liver enzymes. Hematology suggest prednisone therapy.  Assessment & Plan:   Principal Problem:   Hepatitis A infection Active Problems:   Marijuana dependence ([place])   Tobacco dependence   History of incarceration   Asplenia  Hepatitis A Acute Infection: -Patient thinks he was infected after eating at the [employer] [facility], [place] [employer] -He developed acute n/v which resolved and then he had abdominal pain for several days -Treatment is generally supportive care only with IVF and monitoring to ensure spontaneous improvement. -Patient is complicated because he is asplenic, but there does not appear to be anything that we need to do differently in this circumstance -With his asplenia, he is at increased risk for encapsulated organism infections and so he  should be vaccinated against Pneumococcus and Meningococcus (flu shot also ordered as per protocol) -If LFTs begin to downtrend, he may be appropriate for d/c to home tomorrow (assuming he can tolerate PO) -Hep B/C, RPR, HIV all negative -Avoid Tylenol, other hepatotoxic medications. -Markedly elevated LFTs, trending up  GI consulted, will follow up recommendations.  H/o incarceration: -He specifically denies IVDA or other drug use   Marijuana dependence -Cessation encouraged; this should be encouraged on an ongoing basis -UDS + for marijuana.  Tobacco dependence -Encourage cessation.   -This was discussed with the patient and should be reviewed on an ongoing basis.   -Patch declined by patient  Clumped platelets/ Thrombocytopenia /ITP: -Last known platelet count is 87 in 2019 -Repeat CBC in AM shows platelets 19.0 - 41 -Case discussed with Dr. Benay Spice, he suggest repeat CBC in citrate bottle,  -Consider prednisone 60 mg daily for now. He is not actively bleeding, no need for platelet transfusion at this time. -Will hold Lovenox for now and encourage early ambulation  DVT prophylaxis: Early ambulation Code Status: Full code Family Communication: Discussed with patient in detail Disposition Plan: Likely home 08/06/2019 Consultants:    Heme-onc consult  Gastroenterology  Procedures: None. Antimicrobials: Anti-infectives (From admission, onward)   Start     Dose/Rate Route Frequency Ordered Stop   08/02/19 0715  cefTRIAXone (ROCEPHIN) 1 g in sodium chloride 0.9 % 100 mL IVPB     1 g 200 mL/hr over 30 Minutes Intravenous  Once 08/02/19 0712 08/02/19 0902     Subjective: No acute  complaint no nausea no vomiting no fever no chills.  No chest pain abdominal pain.   Objective: Vitals:   08/04/19 1423 08/04/19 2038 08/05/19 0537 08/05/19 1327  BP: 122/80 123/87 120/76 111/71  Pulse: (!) 55 (!) 49 (!) 49 (!) 46  Resp: 18 16 14 18   Temp: 98.5 F (36.9 C) 98.8 F (37.1  C) 98.5 F (36.9 C) 98.6 F (37 C)  TempSrc: Oral Oral Oral Oral  SpO2: 99% 99% 98% 100%  Weight:      Height:        Intake/Output Summary (Last 24 hours) at 08/05/2019 1716 Last data filed at 08/05/2019 1100 Gross per 24 hour  Intake 577.03 ml  Output 1075 ml  Net -497.97 ml   Filed Weights   08/01/19 1951  Weight: 72.6 kg    Examination:  General exam: Appears calm and comfortable  Respiratory system: Clear to auscultation. Respiratory effort normal. Cardiovascular system: S1 & S2 heard, RRR. No JVD, murmurs, rubs, gallops or clicks. No pedal edema. Gastrointestinal system: Abdomen is nondistended, soft and nontender. No organomegaly or masses felt. Normal bowel sounds heard. Central nervous system: Alert and oriented. No focal neurological deficits. Extremities: No swelling, no tenderness Skin: No rashes, lesions or ulcers.  Jaundiced. Psychiatry: Judgement and insight appear normal. Mood & affect appropriate.   Data Reviewed: I have personally reviewed following labs and imaging studies  CBC: Recent Labs  Lab 08/01/19 2123 08/02/19 0018 08/03/19 0608 08/03/19 0900 08/04/19 0559 08/05/19 0655  WBC 5.3 6.0 6.5 6.6 9.3 13.4*  NEUTROABS 1.7 2.3  --   --   --   --   HGB 14.7 15.2 14.2 14.4 14.1 13.9  HCT 42.6 43.6 40.9 41.3 41.1 40.2  MCV 93.4 93.6 94.5 93.9 94.3 95.5  PLT PLATELET CLUMPS NOTED ON SMEAR, COUNT APPEARS DECREASED PLATELET CLUMPS NOTED ON SMEAR, COUNT APPEARS DECREASED 19* 30* PLATELET COUNT PERFORMED ON CITRATED BLOOD PLATELET CLUMPS NOTED ON SMEAR, COUNT APPEARS DECREASED   Basic Metabolic Panel: Recent Labs  Lab 08/01/19 2018 08/03/19 0608 08/04/19 0559 08/05/19 0655  NA 134* 135 134* 137  K 3.3* 3.6 3.8 3.8  CL 102 102 102 102  CO2 24 26 24 25   GLUCOSE 94 89 78 88  BUN 8 7 6 7   CREATININE 0.73 0.62 0.46* 0.42*  CALCIUM 8.4* 8.4* 8.5* 8.2*  MG  --   --  1.8  --   PHOS  --   --  2.5  --    GFR: Estimated Creatinine Clearance: 139.9  mL/min (A) (by C-G formula based on SCr of 0.42 mg/dL (L)). Liver Function Tests: Recent Labs  Lab 08/01/19 2018 08/03/19 0608 08/04/19 0559 08/05/19 0655  AST 2,067* 2,946* 3,464* 1,805*  ALT 2,201* 3,206* 3,828* 3,139*  ALKPHOS 100 95 113 112  BILITOT 4.1* 7.7* 9.9* 11.3*  PROT 7.0 6.3* 6.4* 6.1*  ALBUMIN 3.7 3.2* 3.2* 2.9*   Recent Labs  Lab 08/01/19 2228  LIPASE 26   Recent Labs  Lab 08/01/19 2228  AMMONIA 31   Coagulation Profile: Recent Labs  Lab 08/01/19 2228  INR 1.5*   Cardiac Enzymes: No results for input(s): CKTOTAL, CKMB, CKMBINDEX, TROPONINI in the last 168 hours. BNP (last 3 results) No results for input(s): PROBNP in the last 8760 hours. HbA1C: No results for input(s): HGBA1C in the last 72 hours. CBG: No results for input(s): GLUCAP in the last 168 hours. Lipid Profile: No results for input(s): CHOL, HDL, LDLCALC, TRIG, CHOLHDL, LDLDIRECT in  the last 72 hours. Thyroid Function Tests: No results for input(s): TSH, T4TOTAL, FREET4, T3FREE, THYROIDAB in the last 72 hours. Anemia Panel: No results for input(s): VITAMINB12, FOLATE, FERRITIN, TIBC, IRON, RETICCTPCT in the last 72 hours. Sepsis Labs: No results for input(s): PROCALCITON, LATICACIDVEN in the last 168 hours.  Recent Results (from the past 240 hour(s))  Urine culture     Status: Abnormal   Collection Time: 08/01/19  8:19 PM   Specimen: Urine, Random  Result Value Ref Range Status   Specimen Description   Final    URINE, RANDOM Performed at [facility], [address] [place], [place] [address]    Special Requests   Final    NONE Performed at [facility], [address] [place], [place] [address]    Culture (A)  Final    <10,000 COLONIES/mL INSIGNIFICANT GROWTH Performed at [facility], [place] [address], [place], [place] [address]    Report Status 08/03/2019 FINAL  Final  SARS CORONAVIRUS 2 (TAT 6-24 HRS) Nasopharyngeal Nasopharyngeal Swab      Status: None   Collection Time: 08/01/19  9:23 PM   Specimen: Nasopharyngeal Swab  Result Value Ref Range Status   SARS Coronavirus 2 NEGATIVE NEGATIVE Final    Comment: (NOTE) SARS-CoV-2 target nucleic acids are NOT DETECTED. The SARS-CoV-2 RNA is generally detectable in upper and lower respiratory specimens during the acute phase of infection. Negative results do not preclude SARS-CoV-2 infection, do not rule out co-infections with other pathogens, and should not be used as the sole basis for treatment or other patient management decisions. Negative results must be combined with clinical observations, patient history, and epidemiological information. The expected result is Negative. Fact Sheet for Patients: SugarRoll.be Fact Sheet for Healthcare Providers: https://www.woods-mathews.com/ This test is not yet approved or cleared by the [place] FDA and  has been authorized for detection and/or diagnosis of SARS-CoV-2 by FDA under an Emergency Use Authorization (EUA). This EUA will remain  in effect (meaning this test can be used) for the duration of the COVID-19 declaration under Section 56 4(b)(1) of the Act, 21 U.S.C. section 360bbb-3(b)(1), unless the authorization is terminated or revoked sooner. Performed at [facility], [place] [address] [place], [place] [address]       Radiology Studies: No results found. Scheduled Meds: . docusate sodium  100 mg Oral BID  . meningococcal oligosaccharide  0.5 mL Intramuscular Once  . predniSONE  60 mg Oral Q breakfast   Continuous Infusions: . lactated ringers 125 mL/hr at 08/05/19 1150     LOS: 3 days    Time spent: [place], MD [facility]   If 7PM-7AM, please contact night-coverage

## 2019-08-05 NOTE — Plan of Care (Signed)
Transaminases downtrending, and bilirubin uptrending, typical pattern we see in patients recovering from acute liver injury.  CMP     Component Value Date/Time   NA 137 08/05/2019 0655   K 3.8 08/05/2019 0655   CL 102 08/05/2019 0655   CO2 25 08/05/2019 0655   GLUCOSE 88 08/05/2019 0655   BUN 7 08/05/2019 0655   CREATININE 0.42 (L) 08/05/2019 0655   CALCIUM 8.2 (L) 08/05/2019 0655   PROT 6.1 (L) 08/05/2019 0655   ALBUMIN 2.9 (L) 08/05/2019 0655   AST 1,805 (H) 08/05/2019 0655   ALT 3,139 (H) 08/05/2019 0655   ALKPHOS 112 08/05/2019 0655   BILITOT 11.3 (H) 08/05/2019 0655   GFRNONAA >60 08/05/2019 0655   GFRAA >60 08/05/2019 [id number]    Would monitor supportively; recheck LFTs (and check PT/INR) tomorrow.  No specific medical therapy indicated.  If LFTs continue to downtrend, and tomorrow mental status ok and PT/INR ok, could likely discharge home tomorrow from GI perspective.  [facility] will follow.

## 2019-08-05 NOTE — Plan of Care (Signed)
  Problem: Pain Managment: Goal: General experience of comfort will improve Outcome: Progressing   Problem: Safety: Goal: Ability to remain free from injury will improve Outcome: Progressing   Problem: Elimination: Goal: Will not experience complications related to bowel motility Outcome: Progressing   

## 2019-08-06 DIAGNOSIS — B159 Hepatitis A without hepatic coma: Principal | ICD-10-CM

## 2019-08-06 LAB — COMPREHENSIVE METABOLIC PANEL
ALT: 2262 U/L — ABNORMAL HIGH (ref 0–44)
AST: 824 U/L — ABNORMAL HIGH (ref 15–41)
Albumin: 2.9 g/dL — ABNORMAL LOW (ref 3.5–5.0)
Alkaline Phosphatase: 112 U/L (ref 38–126)
Anion gap: 7 (ref 5–15)
BUN: 6 mg/dL (ref 6–20)
CO2: 26 mmol/L (ref 22–32)
Calcium: 8.5 mg/dL — ABNORMAL LOW (ref 8.9–10.3)
Chloride: 104 mmol/L (ref 98–111)
Creatinine, Ser: 0.52 mg/dL — ABNORMAL LOW (ref 0.61–1.24)
GFR calc Af Amer: >60 mL/min (ref >60)
GFR calc non Af Amer: >60 mL/min (ref >60)
Glucose, Bld: 100 mg/dL — ABNORMAL HIGH (ref 70–99)
Potassium: 3.6 mmol/L (ref 3.5–5.1)
Sodium: 137 mmol/L (ref 135–145)
Total Bilirubin: 13.4 mg/dL — ABNORMAL HIGH (ref 0.3–1.2)
Total Protein: 6.4 g/dL — ABNORMAL LOW (ref 6.5–8.1)

## 2019-08-06 LAB — CBC WITH DIFFERENTIAL/PLATELET
Abs Immature Granulocytes: 0.14 10*3/uL — ABNORMAL HIGH (ref 0.00–0.07)
Basophils Absolute: 0.1 10*3/uL (ref 0.0–0.1)
Basophils Relative: 0 %
Eosinophils Absolute: 0 10*3/uL (ref 0.0–0.5)
Eosinophils Relative: 0 %
HCT: 38.4 % — ABNORMAL LOW (ref 39.0–52.0)
Hemoglobin: 13.1 g/dL (ref 13.0–17.0)
Immature Granulocytes: 1 %
Lymphocytes Relative: 28 %
Lymphs Abs: 4.8 10*3/uL — ABNORMAL HIGH (ref 0.7–4.0)
MCH: 32.5 pg (ref 26.0–34.0)
MCHC: 34.1 g/dL (ref 30.0–36.0)
MCV: 95.3 fL (ref 80.0–100.0)
Monocytes Absolute: 2.3 10*3/uL — ABNORMAL HIGH (ref 0.1–1.0)
Monocytes Relative: 14 %
Neutro Abs: 9.6 10*3/uL — ABNORMAL HIGH (ref 1.7–7.7)
Neutrophils Relative %: 57 %
Platelets: DECREASED 10*3/uL (ref 150–400)
RBC: 4.03 MIL/uL — ABNORMAL LOW (ref 4.22–5.81)
RDW: 12.6 % (ref 11.5–15.5)
WBC: 17 10*3/uL — ABNORMAL HIGH (ref 4.0–10.5)
nRBC: 0.2 % (ref 0.0–0.2)

## 2019-08-06 LAB — PROTIME-INR
INR: 1.3 — ABNORMAL HIGH (ref 0.8–1.2)
Prothrombin Time: 16.5 seconds — ABNORMAL HIGH (ref 11.4–15.2)

## 2019-08-06 LAB — PLATELET COUNT: Platelets: 69 10*3/uL — ABNORMAL LOW (ref 150–400)

## 2019-08-06 MED ORDER — PREDNISONE 20 MG PO TABS: 60.0000 mg | ORAL_TABLET | Freq: Every day | ORAL | 0 refills | Status: AC

## 2019-08-06 NOTE — Progress Notes (Signed)
Pt is being discharged home. Discharge instructions including follow up appointments and medications given. Pt had no further questions at this time. 

## 2019-08-06 NOTE — Progress Notes (Signed)
Patient now agreeable to PCP appointment. CSW arranged appointment for January 20th @3 :30pm. Patient reports he will make the appointment. Information written on AVS.

## 2019-08-06 NOTE — Discharge Summary (Signed)
Physician Discharge Summary  Todd Marshall [name] DOB: [birth year] DOA: 08/01/2019  PCP: Patient, No Pcp Per  Admit date: 08/01/2019 Discharge date: 08/06/2019  Admitted From: Home Disposition:  Home  Discharge Condition:Stable CODE STATUS:FULL Diet recommendation: Regular   Brief/Interim Summary:  Patient is a 29 year old male with history of ITP requiring splenectomy, history of  leukemia in childhood who presented with abdominal pain.  Found to have severely elevated liver enzymes.  Hepatitis panel was positive for hepatitis A infection.  GI consulted.  Liver enzymes slowly improving.  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  Started on prednisone.   Currently, his abdominal pain has resolved.  He is tolerating diet.  GI cleared him for discharge.  His liver enzymes, platelets level are improving.  He is hemodynamically stable for discharge to home today.  Following problems were addressed during his hospitalization:  Acute hepatitis A infection: Reported that he recently ate at [facility].  Presented with abdominal pain.  Hepatitis panel positive for hepatitis infection.  History of splenectomy due to history of ITP.  Vaccinated against pneumococcus. Liver enzymes slowly improving but bilirubin is high.  GI was following.  He is icteric.  Denies any abdominal pain today.  Tolerating diet.  No nausea, vomiting.  Hepatitis B/C, RPR, HIV negative.  Avoid hepatotoxic medications.   Check CMP in a week.  Follow-up with [facility] as an outpatient.  History of ITP:  He was following at [facility] hematology but has not followed with them since last several years..  Platelets counts were low, so started on prednisone.  CBC frequently shows clumped platelets.    Repeat platelets level this afternoon showed platelets level of 69. I have requested patient to follow-up with hematologist at our cancer center.  He will continue prednisone 60 mg daily for a week.  I have requested Dr.  Lindi Adie for arranging a follow-up appointment.  Marijuana/tobacco dependence: Counseled for cessation.  Leukocytosis: Most likely secondary to steroids.  Check CBC in a week.    Discharge Diagnoses:  Principal Problem:   Hepatitis A infection Active Problems:   Marijuana dependence ([place])   Tobacco dependence   History of incarceration   Asplenia    Discharge Instructions  Discharge Instructions    Diet general   Complete by: As directed    Discharge instructions   Complete by: As directed    1)Please follow up with a PCP in a week. Do a CMP and CBC during the follow up. 2)Please follow-up with gastroenterology in 4 weeks.  Name and number the provider has been attached.  You will be called for for appointment 3)Follow up with hematology as an outpatient for the management of your ITP.  You  will be called for appointment.   Increase activity slowly   Complete by: As directed      Allergies as of 08/06/2019   No Known Allergies     Medication List    TAKE these medications   Advil 200 MG tablet Generic drug: ibuprofen Take 400 mg by mouth every 6 (six) hours as needed for moderate pain.   predniSONE 20 MG tablet Commonly known as: DELTASONE Take 3 tablets (60 mg total) by mouth daily with breakfast for 7 days. Start taking on: August 07, 2019      Follow-up Information    Wilford Corner, MD. Schedule an appointment as soon as possible for a visit in 4 week(s).   Specialty: Gastroenterology Contact information: [id number]  [address] [place] [address] [phone]          No Known Allergies  Consultations:  GI   Procedures/Studies: US Abdomen Limited RUQ  Result Date: 08/01/2019 CLINICAL DATA:  Elevated LFTs EXAM: ULTRASOUND ABDOMEN LIMITED RIGHT UPPER QUADRANT COMPARISON:  None. FINDINGS: Gallbladder: No gallstones or wall thickening visualized. No sonographic Murphy sign noted by sonographer. Common bile duct: Diameter: 2.6 mm  Liver: No focal lesion identified. Within normal limits in parenchymal echogenicity. Portal vein is patent on color Doppler imaging with normal direction of blood flow towards the liver. Other: Small 5 mm porta hepatis lymph node is noted. IMPRESSION: No acute abnormality noted. Electronically Signed   By: Inez Catalina M.D.   On: 08/01/2019 22:28       Subjective: Patient seen and examined at the bedside this afternoon.  Hemodynamically stable for discharge today.  Discharge Exam: Vitals:   08/06/19 0529 08/06/19 1351  BP: 122/75 116/77  Pulse: 74 (!) 51  Resp: 16 18  Temp: 98.8 F (37.1 C) 98.8 F (37.1 C)  SpO2: 99% 98%   Vitals:   08/05/19 1327 08/05/19 2050 08/06/19 0529 08/06/19 1351  BP: 111/71 111/81 122/75 116/77  Pulse: (!) 46 63 74 (!) 51  Resp: 18 16 16 18   Temp: 98.6 F (37 C) 97.9 F (36.6 C) 98.8 F (37.1 C) 98.8 F (37.1 C)  TempSrc: Oral Oral Oral Oral  SpO2: 100% 99% 99% 98%  Weight:      Height:        General: Pt is alert, awake, not in acute distress Cardiovascular: RRR, S1/S2 +, no rubs, no gallops Respiratory: CTA bilaterally, no wheezing, no rhonchi Abdominal: Soft, NT, ND, bowel sounds + Extremities: no edema, no cyanosis    The results of significant diagnostics from this hospitalization (including imaging, microbiology, ancillary and laboratory) are listed below for reference.     Microbiology: Recent Results (from the past 240 hour(s))  Urine culture     Status: Abnormal   Collection Time: 08/01/19  8:19 PM   Specimen: Urine, Random  Result Value Ref Range Status   Specimen Description   Final    URINE, RANDOM Performed at [facility], [address] [place], [place] [address]    Special Requests   Final    NONE Performed at [facility], [address] [place], [place] [address]    Culture (A)  Final    <10,000 COLONIES/mL INSIGNIFICANT GROWTH Performed at [facility], [place] [address]  [place], [place] [address]    Report Status 08/03/2019 FINAL  Final  SARS CORONAVIRUS 2 (TAT 6-24 HRS) Nasopharyngeal Nasopharyngeal Swab     Status: None   Collection Time: 08/01/19  9:23 PM   Specimen: Nasopharyngeal Swab  Result Value Ref Range Status   SARS Coronavirus 2 NEGATIVE NEGATIVE Final    Comment: (NOTE) SARS-CoV-2 target nucleic acids are NOT DETECTED. The SARS-CoV-2 RNA is generally detectable in upper and lower respiratory specimens during the acute phase of infection. Negative results do not preclude SARS-CoV-2 infection, do not rule out co-infections with other pathogens, and should not be used as the sole basis for treatment or other patient management decisions. Negative results must be combined with clinical observations, patient history, and epidemiological information. The expected result is Negative. Fact Sheet for Patients: SugarRoll.be Fact Sheet for Healthcare Providers: https://www.woods-mathews.com/ This test is not yet approved or cleared by the [place] FDA and  has been  authorized for detection and/or diagnosis of SARS-CoV-2 by FDA under an Emergency Use Authorization (EUA). This EUA will remain  in effect (meaning this test can be used) for the duration of the COVID-19 declaration under Section 56 4(b)(1) of the Act, 21 U.S.C. section 360bbb-3(b)(1), unless the authorization is terminated or revoked sooner. Performed at [facility], [place] [address], [place], [place] [address]      Labs: BNP (last 3 results) No results for input(s): BNP in the last 8760 hours. Basic Metabolic Panel: Recent Labs  Lab 08/01/19 2018 08/03/19 0608 08/04/19 0559 08/05/19 0655 08/06/19 0508  NA 134* 135 134* 137 137  K 3.3* 3.6 3.8 3.8 3.6  CL 102 102 102 102 104  CO2 24 26 24 25 26   GLUCOSE 94 89 78 88 100*  BUN 8 7 6 7 6   CREATININE 0.73 0.62 0.46* 0.42* 0.52*  CALCIUM 8.4* 8.4* 8.5* 8.2* 8.5*  MG  --   --   1.8  --   --   PHOS  --   --  2.5  --   --    Liver Function Tests: Recent Labs  Lab 08/01/19 2018 08/03/19 0608 08/04/19 0559 08/05/19 0655 08/06/19 0508  AST 2,067* 2,946* 3,464* 1,805* 824*  ALT 2,201* 3,206* 3,828* 3,139* 2,262*  ALKPHOS 100 95 113 112 112  BILITOT 4.1* 7.7* 9.9* 11.3* 13.4*  PROT 7.0 6.3* 6.4* 6.1* 6.4*  ALBUMIN 3.7 3.2* 3.2* 2.9* 2.9*   Recent Labs  Lab 08/01/19 2228  LIPASE 26   Recent Labs  Lab 08/01/19 2228  AMMONIA 31   CBC: Recent Labs  Lab 08/01/19 2123 08/02/19 0018 08/03/19 0608 08/03/19 0900 08/04/19 0559 08/05/19 0655 08/06/19 0508 08/06/19 1423  WBC 5.3 6.0 6.5 6.6 9.3 13.4* 17.0*  --   NEUTROABS 1.7 2.3  --   --   --   --  9.6*  --   HGB 14.7 15.2 14.2 14.4 14.1 13.9 13.1  --   HCT 42.6 43.6 40.9 41.3 41.1 40.2 38.4*  --   MCV 93.4 93.6 94.5 93.9 94.3 95.5 95.3  --   PLT PLATELET CLUMPS NOTED ON SMEAR, COUNT APPEARS DECREASED PLATELET CLUMPS NOTED ON SMEAR, COUNT APPEARS DECREASED 19* 30* PLATELET COUNT PERFORMED ON CITRATED BLOOD PLATELET CLUMPS NOTED ON SMEAR, COUNT APPEARS DECREASED PLATELET CLUMPS NOTED ON SMEAR, COUNT APPEARS DECREASED 69*   Cardiac Enzymes: No results for input(s): CKTOTAL, CKMB, CKMBINDEX, TROPONINI in the last 168 hours. BNP: Invalid input(s): POCBNP CBG: No results for input(s): GLUCAP in the last 168 hours. D-Dimer No results for input(s): DDIMER in the last 72 hours. Hgb A1c No results for input(s): HGBA1C in the last 72 hours. Lipid Profile No results for input(s): CHOL, HDL, LDLCALC, TRIG, CHOLHDL, LDLDIRECT in the last 72 hours. Thyroid function studies No results for input(s): TSH, T4TOTAL, T3FREE, THYROIDAB in the last 72 hours.  Invalid input(s): FREET3 Anemia work up No results for input(s): VITAMINB12, FOLATE, FERRITIN, TIBC, IRON, RETICCTPCT in the last 72 hours. Urinalysis    Component Value Date/Time   COLORURINE AMBER (A) 08/02/2019 2020   APPEARANCEUR CLEAR 08/02/2019 2020    LABSPEC 1.028 08/02/2019 2020   PHURINE 6.0 08/02/2019 2020   GLUCOSEU NEGATIVE 08/02/2019 2020   HGBUR SMALL (A) 08/02/2019 2020   BILIRUBINUR MODERATE (A) 08/02/2019 2020   KETONESUR 20 (A) 08/02/2019 2020   PROTEINUR 100 (A) 08/02/2019 2020   NITRITE NEGATIVE 08/02/2019 2020   LEUKOCYTESUR NEGATIVE 08/02/2019 2020   Sepsis  Labs Invalid input(s): PROCALCITONIN,  WBC,  LACTICIDVEN Microbiology Recent Results (from the past 240 hour(s))  Urine culture     Status: Abnormal   Collection Time: 08/01/19  8:19 PM   Specimen: Urine, Random  Result Value Ref Range Status   Specimen Description   Final    URINE, RANDOM Performed at [facility], [address] [place], [place] [address]    Special Requests   Final    NONE Performed at [facility], [address] [place], [place] [address]    Culture (A)  Final    <10,000 COLONIES/mL INSIGNIFICANT GROWTH Performed at [facility], [place] [address] [place], [place] [address]    Report Status 08/03/2019 FINAL  Final  SARS CORONAVIRUS 2 (TAT 6-24 HRS) Nasopharyngeal Nasopharyngeal Swab     Status: None   Collection Time: 08/01/19  9:23 PM   Specimen: Nasopharyngeal Swab  Result Value Ref Range Status   SARS Coronavirus 2 NEGATIVE NEGATIVE Final    Comment: (NOTE) SARS-CoV-2 target nucleic acids are NOT DETECTED. The SARS-CoV-2 RNA is generally detectable in upper and lower respiratory specimens during the acute phase of infection. Negative results do not preclude SARS-CoV-2 infection, do not rule out co-infections with other pathogens, and should not be used as the sole basis for treatment or other patient management decisions. Negative results must be combined with clinical observations, patient history, and epidemiological information. The expected result is Negative. Fact Sheet for Patients: SugarRoll.be Fact Sheet for Healthcare  Providers: https://www.woods-mathews.com/ This test is not yet approved or cleared by the [place] FDA and  has been authorized for detection and/or diagnosis of SARS-CoV-2 by FDA under an Emergency Use Authorization (EUA). This EUA will remain  in effect (meaning this test can be used) for the duration of the COVID-19 declaration under Section 56 4(b)(1) of the Act, 21 U.S.C. section 360bbb-3(b)(1), unless the authorization is terminated or revoked sooner. Performed at [facility], [place] [address] [place], [place] [address]     Please note: You were cared for by a hospitalist during your hospital stay. Once you are discharged, your primary care physician will handle any further medical issues. Please note that NO REFILLS for any discharge medications will be authorized once you are discharged, as it is imperative that you return to your primary care physician (or establish a relationship with a primary care physician if you do not have one) for your post hospital discharge needs so that they can reassess your need for medications and monitor your lab values.    Time coordinating discharge: 40 minutes  SIGNED:   Shelly Coss, MD  [facility] 08/06/2019, 3:48 PM Pager [phone]  If 7PM-7AM, please contact night-coverage [email or web address] Password [email or web address]

## 2019-08-06 NOTE — Plan of Care (Signed)
Transaminases continue to downtrend, bilirubin increasing some (typical pattern seen in recovery from acute liver injury), compatible with resolving acute hepatitis A.  PT/INR and renal function ok.  Plts chronically low due to history of ITP.  Assuming patient can eat ok (soft diet at least), and no down-shift in mental status, patient is ok to be discharged home from GI perspective.  Would have patient follow-up with Dr. Michail Sermon in 4-6 weeks upon hospital discharge and follow-up LFTs in 2-3 weeks upon hospital discharge.  [facility] will sign-off; please call with questions; thank you for the consultation.

## 2019-08-06 NOTE — Progress Notes (Signed)
PROGRESS NOTE    Todd Marshall  [name] DOB: 1991-06-09 DOA: 08/01/2019 PCP: Patient, No Pcp Per   Brief Narrative:  Patient is a 29 year old male with history of ITP requiring splenectomy, history of  leukemia in childhood who presented with abdominal pain.  Found to have severely elevated liver enzymes.  Hepatitis panel was positive for hepatitis A infection.  GI consulted.  Liver enzymes slowly improving.  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  Started on prednisone   Assessment & Plan:   Principal Problem:   Hepatitis A infection Active Problems:   Marijuana dependence ([place])   Tobacco dependence   History of incarceration   Asplenia   Acute hepatitis A infection: Reported that he recently ate at [employer].  Presented with abdominal pain.  Hepatitis panel positive for hepatitis infection.  History of splenectomy due to history of ITP.  Vaccinated against pneumococcus. Liver enzymes slowly improving but bilirubin is high.  GI following.  He is icteric.  Denies any abdominal pain today.  Tolerating diet.  No nausea, vomiting.  Hepatitis B/C, RPR, HIV negative.  Avoid hepatotoxic medications.  Will check CMP tomorrow.  History of ITP: Follows at [facility] hematology.  Platelets counts were low so started on prednisone.  CBC frequently shows clumped platelets.  I have requested for reevaluation in a citrate bottle.  Will check CBC tomorrow.  I have requested patient to follow-up with his hematologist at [place] dependence: Counseled cessation.  Leukocytosis: Most likely secondary to steroids.  Check CBC tomorrow.           DVT prophylaxis:None Code Status: Full Family Communication: Family member present at the bedside Disposition Plan: Home tomorrow   Consultants: GI  Procedures: None  Antimicrobials:  Anti-infectives (From admission, onward)   Start     Dose/Rate Route Frequency Ordered Stop   08/02/19 0715   cefTRIAXone (ROCEPHIN) 1 g in sodium chloride 0.9 % 100 mL IVPB     1 g 200 mL/hr over 30 Minutes Intravenous  Once 08/02/19 [id number] 08/02/19 0902      Subjective: Patient seen and examined at the bedside this morning.  Feels better.  Denies any abdominal pain.  Tolerating diet.    Objective: Vitals:   08/05/19 1327 08/05/19 2050 08/06/19 0529 08/06/19 1351  BP: 111/71 111/81 122/75 116/77  Pulse: (!) 46 63 74 (!) 51  Resp: 18 16 16 18   Temp: 98.6 F (37 C) 97.9 F (36.6 C) 98.8 F (37.1 C) 98.8 F (37.1 C)  TempSrc: Oral Oral Oral Oral  SpO2: 100% 99% 99% 98%  Weight:      Height:        Intake/Output Summary (Last 24 hours) at 08/06/2019 1501 Last data filed at 08/06/2019 1042 Gross per 24 hour  Intake 973.38 ml  Output 3075 ml  Net -2101.62 ml   Filed Weights   08/01/19 1951  Weight: 72.6 kg    Examination:  General exam: Appears calm and comfortable ,Not in distress,average built HEENT:PERRL,Oral mucosa moist, Ear/Nose normal on gross exam Respiratory system: Bilateral equal air entry, normal vesicular breath sounds, no wheezes or crackles  Cardiovascular system: S1 & S2 heard, RRR. No JVD, murmurs, rubs, gallops or clicks. No pedal edema. Gastrointestinal system: Abdomen is nondistended, soft and nontender. No organomegaly or masses felt. Normal bowel sounds heard. Central nervous system: Alert and oriented. No focal neurological deficits. Extremities: No edema, no clubbing ,no cyanosis, distal peripheral pulses palpable. Skin: Icterus, tattoos  Data Reviewed: I have personally reviewed following labs and imaging studies  CBC: Recent Labs  Lab 08/01/19 2123 08/02/19 0018 08/03/19 0608 08/03/19 0900 08/04/19 0559 08/05/19 0655 08/06/19 0508  WBC 5.3 6.0 6.5 6.6 9.3 13.4* 17.0*  NEUTROABS 1.7 2.3  --   --   --   --  9.6*  HGB 14.7 15.2 14.2 14.4 14.1 13.9 13.1  HCT 42.6 43.6 40.9 41.3 41.1 40.2 38.4*  MCV 93.4 93.6 94.5 93.9 94.3 95.5 95.3  PLT  PLATELET CLUMPS NOTED ON SMEAR, COUNT APPEARS DECREASED PLATELET CLUMPS NOTED ON SMEAR, COUNT APPEARS DECREASED 19* 30* PLATELET COUNT PERFORMED ON CITRATED BLOOD PLATELET CLUMPS NOTED ON SMEAR, COUNT APPEARS DECREASED PLATELET CLUMPS NOTED ON SMEAR, COUNT APPEARS DECREASED   Basic Metabolic Panel: Recent Labs  Lab 08/01/19 2018 08/03/19 0608 08/04/19 0559 08/05/19 0655 08/06/19 0508  NA 134* 135 134* 137 137  K 3.3* 3.6 3.8 3.8 3.6  CL 102 102 102 102 104  CO2 24 26 24 25 26   GLUCOSE 94 89 78 88 100*  BUN 8 7 6 7 6   CREATININE 0.73 0.62 0.46* 0.42* 0.52*  CALCIUM 8.4* 8.4* 8.5* 8.2* 8.5*  MG  --   --  1.8  --   --   PHOS  --   --  2.5  --   --    GFR: Estimated Creatinine Clearance: 139.9 mL/min (A) (by C-G formula based on SCr of 0.52 mg/dL (L)). Liver Function Tests: Recent Labs  Lab 08/01/19 2018 08/03/19 0608 08/04/19 0559 08/05/19 0655 08/06/19 0508  AST 2,067* 2,946* 3,464* 1,805* 824*  ALT 2,201* 3,206* 3,828* 3,139* 2,262*  ALKPHOS 100 95 113 112 112  BILITOT 4.1* 7.7* 9.9* 11.3* 13.4*  PROT 7.0 6.3* 6.4* 6.1* 6.4*  ALBUMIN 3.7 3.2* 3.2* 2.9* 2.9*   Recent Labs  Lab 08/01/19 2228  LIPASE 26   Recent Labs  Lab 08/01/19 2228  AMMONIA 31   Coagulation Profile: Recent Labs  Lab 08/01/19 2228 08/06/19 0508  INR 1.5* 1.3*   Cardiac Enzymes: No results for input(s): CKTOTAL, CKMB, CKMBINDEX, TROPONINI in the last 168 hours. BNP (last 3 results) No results for input(s): PROBNP in the last 8760 hours. HbA1C: No results for input(s): HGBA1C in the last 72 hours. CBG: No results for input(s): GLUCAP in the last 168 hours. Lipid Profile: No results for input(s): CHOL, HDL, LDLCALC, TRIG, CHOLHDL, LDLDIRECT in the last 72 hours. Thyroid Function Tests: No results for input(s): TSH, T4TOTAL, FREET4, T3FREE, THYROIDAB in the last 72 hours. Anemia Panel: No results for input(s): VITAMINB12, FOLATE, FERRITIN, TIBC, IRON, RETICCTPCT in the last 72  hours. Sepsis Labs: No results for input(s): PROCALCITON, LATICACIDVEN in the last 168 hours.  Recent Results (from the past 240 hour(s))  Urine culture     Status: Abnormal   Collection Time: 08/01/19  8:19 PM   Specimen: Urine, Random  Result Value Ref Range Status   Specimen Description   Final    URINE, RANDOM Performed at [facility], [address] [place], [place] [address]    Special Requests   Final    NONE Performed at [facility], [address] [place], [place] [address]    Culture (A)  Final    <10,000 COLONIES/mL INSIGNIFICANT GROWTH Performed at [facility], [place] [address] [place], [place] [address]    Report Status 08/03/2019 FINAL  Final  SARS CORONAVIRUS 2 (TAT 6-24 HRS) Nasopharyngeal Nasopharyngeal Swab  Status: None   Collection Time: 08/01/19  9:23 PM   Specimen: Nasopharyngeal Swab  Result Value Ref Range Status   SARS Coronavirus 2 NEGATIVE NEGATIVE Final    Comment: (NOTE) SARS-CoV-2 target nucleic acids are NOT DETECTED. The SARS-CoV-2 RNA is generally detectable in upper and lower respiratory specimens during the acute phase of infection. Negative results do not preclude SARS-CoV-2 infection, do not rule out co-infections with other pathogens, and should not be used as the sole basis for treatment or other patient management decisions. Negative results must be combined with clinical observations, patient history, and epidemiological information. The expected result is Negative. Fact Sheet for Patients: SugarRoll.be Fact Sheet for Healthcare Providers: https://www.woods-mathews.com/ This test is not yet approved or cleared by the [place] FDA and  has been authorized for detection and/or diagnosis of SARS-CoV-2 by FDA under an Emergency Use Authorization (EUA). This EUA will remain  in effect (meaning this test can be used) for the duration of the COVID-19  declaration under Section 56 4(b)(1) of the Act, 21 U.S.C. section 360bbb-3(b)(1), unless the authorization is terminated or revoked sooner. Performed at [facility], [place] [address] [place], [place] [address]          Radiology Studies: No results found.      Scheduled Meds: . docusate sodium  100 mg Oral BID  . meningococcal oligosaccharide  0.5 mL Intramuscular Once  . predniSONE  60 mg Oral Q breakfast   Continuous Infusions: . lactated ringers 125 mL/hr at 08/06/19 0913     LOS: 4 days    Time spent: More than 50% of that time was spent in counseling and/or coordination of care.      Shelly Coss, MD [facility] Pager [phone]  If 7PM-7AM, please contact night-coverage [email or web address] Password [email or web address] 08/06/2019, 3:01 PM

## 2019-08-06 NOTE — Progress Notes (Signed)
CSW met with the patient at bedside to establish a PCP. Patient reports, " I applied for medicaid on January 8th, when I get my medicaid I will find my own doctor." Patient declines assistance with arranging a follow up appointment.  [facility] department will sign off at this time.

## 2019-08-13 NOTE — Progress Notes (Signed)
Patient ID: Todd Marshall, male   DOB: [birth year], 29 y.o.   MRN: [id number]  Virtual Visit via Telephone Note  I connected with Todd Marshall on 08/14/19 at  3:30 PM EST by telephone and verified that I am speaking with the correct person using two identifiers.   I discussed the limitations, risks, security and privacy concerns of performing an evaluation and management service by telephone and the availability of in person appointments. I also discussed with the patient that there may be a patient responsible charge related to this service. The patient expressed understanding and agreed to proceed.  PATIENT visit by telephone virtually in the context of Covid-19 pandemic. Patient location:  home My Location:  [facility] office Persons on the call:  Me and the patient   History of Present Illness: After hospitalization 1/7-1/06/2020 for ITP, elevated LFT, Hepatitis A.  He has a h/o splenectomy and has had pneumococcal vaccines.  He says he developed symptoms after eating at [facility].  Abdominal pain is resolved.  He is still on prednisone.  He says his skin color and eyes are no longer yellow.  Urine is clear.  Appetite is great.  No N/V. BM normal.    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 Found to have severely elevated liver enzymes. Hepatitis panel was positive for hepatitis A infection. GI consulted. Liver enzymes slowly improving. 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 Started on prednisone.   Currently, his abdominal pain has resolved.  He is tolerating diet.  GI cleared him for discharge.  His liver enzymes, platelets level are improving.  He is hemodynamically stable for discharge to home today.  Following problems were addressed during his hospitalization:  Acute hepatitis A infection: Reported that he  recentlyate at [employer].Presented with abdominal pain. Hepatitis panel positive for hepatitis infection. History of splenectomy due to history of ITP. Vaccinated against pneumococcus. Liver enzymes slowly improving but bilirubin is high. GI was following. He is icteric. Denies any abdominal pain today. Tolerating diet. No nausea, vomiting. Hepatitis B/C,RPR,HIV negative. Avoid hepatotoxic medications.  Check CMP in a week.  Follow-up with [facility] as an outpatient.  History of ITP: He was following at [facility] hematology but has not followed with them since last several years.. Platelets counts were low, so started on prednisone. CBC frequently shows clumped platelets.   Repeat platelets level this afternoon showed platelets level of 69.I have requested patient to follow-up with hematologist at our cancer center.  He will continue prednisone 60 mg daily for a week.  I have requested Dr. Lindi Adie for arranging a follow-up appointment.  Marijuana/tobacco dependence:Counseled for cessation.  Leukocytosis: Most likely secondary to steroids. Check CBC in a week.    Observations/Objective:  NAD.  A&Ox3   Assessment and Plan:   1. Asplenia Immunized appropriately - CBC with Differential/Platelet; Future - Ambulatory referral to Gastroenterology  2. Viral hepatitis A without hepatic coma Resolving subjectively - Comprehensive metabolic panel; Future - Ambulatory referral to Gastroenterology  3. Elevated LFTs Drink 8-10 cups water daily - Comprehensive metabolic panel; Future - Ambulatory referral to Gastroenterology - Ambulatory referral to Hematology  4. Leukocytosis, unspecified type Finish prednisone - CBC with Differential/Platelet; Future - Ambulatory referral to Hematology  5. Hospital discharge follow-up Doing well  6. Idiopathic thrombocytopenic purpura (ITP) (HCC) - Comprehensive metabolic panel; Future - CBC with Differential/Platelet;  Future -  Ambulatory referral to Hematology   Follow Up Instructions: Assign PCP in 6-8 weeks   I discussed the assessment and treatment plan with the patient. The patient was provided an opportunity to ask questions and all were answered. The patient agreed with the plan and demonstrated an understanding of the instructions.   The patient was advised to call back or seek an in-person evaluation if the symptoms worsen or if the condition fails to improve as anticipated.  I provided 12 minutes of non-face-to-face time during this encounter.   Freeman Caldron, PA-C

## 2019-08-14 ENCOUNTER — Ambulatory Visit: Payer: MEDICAID | Attending: Physician Assistant | Admitting: Physician Assistant

## 2019-08-14 ENCOUNTER — Encounter: Payer: Self-pay | Admitting: Gastroenterology

## 2019-08-14 ENCOUNTER — Other Ambulatory Visit: Payer: Self-pay

## 2019-08-14 DIAGNOSIS — R7989 Other specified abnormal findings of blood chemistry: Secondary | ICD-10-CM

## 2019-08-14 DIAGNOSIS — B159 Hepatitis A without hepatic coma: Secondary | ICD-10-CM

## 2019-08-14 DIAGNOSIS — D72829 Elevated white blood cell count, unspecified: Secondary | ICD-10-CM

## 2019-08-14 DIAGNOSIS — Q8901 Asplenia (congenital): Secondary | ICD-10-CM

## 2019-08-14 DIAGNOSIS — D693 Immune thrombocytopenic purpura: Secondary | ICD-10-CM

## 2019-08-14 DIAGNOSIS — Z09 Encounter for follow-up examination after completed treatment for conditions other than malignant neoplasm: Secondary | ICD-10-CM

## 2019-08-19 ENCOUNTER — Other Ambulatory Visit: Payer: Self-pay

## 2019-08-26 ENCOUNTER — Other Ambulatory Visit: Payer: Self-pay | Admitting: Family

## 2019-08-26 DIAGNOSIS — D696 Thrombocytopenia, unspecified: Secondary | ICD-10-CM

## 2019-08-27 ENCOUNTER — Inpatient Hospital Stay: Payer: Self-pay | Attending: Family | Admitting: Family

## 2019-08-27 ENCOUNTER — Inpatient Hospital Stay: Payer: Self-pay

## 2019-08-27 ENCOUNTER — Encounter: Payer: Self-pay | Admitting: Family

## 2019-08-27 ENCOUNTER — Telehealth: Payer: Self-pay | Admitting: *Deleted

## 2019-08-27 ENCOUNTER — Ambulatory Visit: Payer: Self-pay | Admitting: Gastroenterology

## 2019-08-27 ENCOUNTER — Other Ambulatory Visit: Payer: Self-pay

## 2019-08-27 ENCOUNTER — Telehealth: Payer: Self-pay | Admitting: Family

## 2019-08-27 VITALS — BP 111/83 | HR 65 | Temp 97.1°F | Resp 17 | Ht 71.0 in | Wt 156.0 lb

## 2019-08-27 DIAGNOSIS — F1290 Cannabis use, unspecified, uncomplicated: Secondary | ICD-10-CM | POA: Insufficient documentation

## 2019-08-27 DIAGNOSIS — D696 Thrombocytopenia, unspecified: Secondary | ICD-10-CM

## 2019-08-27 DIAGNOSIS — D693 Immune thrombocytopenic purpura: Secondary | ICD-10-CM | POA: Insufficient documentation

## 2019-08-27 DIAGNOSIS — Z87891 Personal history of nicotine dependence: Secondary | ICD-10-CM | POA: Insufficient documentation

## 2019-08-27 DIAGNOSIS — R5383 Other fatigue: Secondary | ICD-10-CM | POA: Insufficient documentation

## 2019-08-27 DIAGNOSIS — R61 Generalized hyperhidrosis: Secondary | ICD-10-CM | POA: Insufficient documentation

## 2019-08-27 DIAGNOSIS — R7989 Other specified abnormal findings of blood chemistry: Secondary | ICD-10-CM | POA: Insufficient documentation

## 2019-08-27 LAB — CMP (CANCER CENTER ONLY)
ALT: 118 U/L — ABNORMAL HIGH (ref 0–44)
AST: 46 U/L — ABNORMAL HIGH (ref 15–41)
Albumin: 4.1 g/dL (ref 3.5–5.0)
Alkaline Phosphatase: 78 U/L (ref 38–126)
Anion gap: 5 (ref 5–15)
BUN: 10 mg/dL (ref 6–20)
CO2: 31 mmol/L (ref 22–32)
Calcium: 9.6 mg/dL (ref 8.9–10.3)
Chloride: 105 mmol/L (ref 98–111)
Creatinine: 0.87 mg/dL (ref 0.61–1.24)
GFR, Est AFR Am: >60 mL/min (ref >60)
GFR, Estimated: >60 mL/min (ref >60)
Glucose, Bld: 126 mg/dL — ABNORMAL HIGH (ref 70–99)
Potassium: 3.8 mmol/L (ref 3.5–5.1)
Sodium: 141 mmol/L (ref 135–145)
Total Bilirubin: 2.3 mg/dL — ABNORMAL HIGH (ref 0.3–1.2)
Total Protein: 7.3 g/dL (ref 6.5–8.1)

## 2019-08-27 LAB — CBC WITH DIFFERENTIAL (CANCER CENTER ONLY)
Abs Immature Granulocytes: 0.01 10*3/uL (ref 0.00–0.07)
Basophils Absolute: 0 10*3/uL (ref 0.0–0.1)
Basophils Relative: 1 %
Eosinophils Absolute: 0.1 10*3/uL (ref 0.0–0.5)
Eosinophils Relative: 2 %
HCT: 42.4 % (ref 39.0–52.0)
Hemoglobin: 14.2 g/dL (ref 13.0–17.0)
Immature Granulocytes: 0 %
Lymphocytes Relative: 40 %
Lymphs Abs: 2.6 10*3/uL (ref 0.7–4.0)
MCH: 32.3 pg (ref 26.0–34.0)
MCHC: 33.5 g/dL (ref 30.0–36.0)
MCV: 96.6 fL (ref 80.0–100.0)
Monocytes Absolute: 0.9 10*3/uL (ref 0.1–1.0)
Monocytes Relative: 14 %
Neutro Abs: 2.8 10*3/uL (ref 1.7–7.7)
Neutrophils Relative %: 43 %
Platelet Count: 59 10*3/uL — ABNORMAL LOW (ref 150–400)
RBC: 4.39 MIL/uL (ref 4.22–5.81)
RDW: 12.2 % (ref 11.5–15.5)
WBC Count: 6.5 10*3/uL (ref 4.0–10.5)
nRBC: 0 % (ref 0.0–0.2)

## 2019-08-27 LAB — LACTATE DEHYDROGENASE: LDH: 147 U/L (ref 98–192)

## 2019-08-27 LAB — PLATELET BY CITRATE

## 2019-08-27 LAB — SAVE SMEAR(SSMR), FOR PROVIDER SLIDE REVIEW

## 2019-08-27 NOTE — Telephone Encounter (Signed)
Called pt regarding today appt. Pt advised he is pulling into parkaing lot. Lift Driver was late picking him up

## 2019-08-27 NOTE — Telephone Encounter (Signed)
Appointments scheduled and patient has been notified per 2/2 los

## 2019-08-27 NOTE — Progress Notes (Signed)
Hematology/Oncology Consultation   Name: Todd Marshall      MRN: [id number]    Location: Room/bed info not found  Date: 08/27/2019 Time:11:54 AM   REFERRING PHYSICIAN: Argentina Donovan, PA-C  REASON FOR CONSULT:  Elevated LFT's, leukocytosis and ITP   DIAGNOSIS:  ITP with splenectomy Hepatitis A  HISTORY OF PRESENT ILLNESS: Todd Marshall is a pleasant 29 yo African American gentleman with history of ITP since childhood. He states that he does not think that he had a childhood leukemia but that he underwent lots of testing (including bone marrow biopsy) for the diagnosis of ITP. He was seen by Dr. Doren Custard at [facility].  He states that he did have a splenectomy at 73 or 29 years of age due to the ITP.  He was recently in the hospital and found to have hepatitis A and elevated LFT's. His platelet count was also down to 19.  He was treated with a week of prednisone (finished last week) after discharge and platelet count today is 59. He typically runs in the 80's looking back over the last 7-8 years.   He was seen by GI and states that he has an appointment with a hepatologist later today.  His LFT's are much improved since his discharge.  He denies any bleeding, bruising or petechiae.  He has occasional night sweats.  No fever, chills, n/v, cough, rash, dizziness, SOB, chest pain, palpitations, abdominal pain or changes in bowel or bladder habits.  No swelling, tenderness, numbness or tingling in his extremities.  No falls or syncopal episodes.  He does have mild fatigue at times and states that he falls asleep if he sits down for long.  He does smoke pot daily but does not use any other recreational drugs. He does not drink alcoholic beverages.  He has maintained a good appetite and is hydrating well throughout the day.  He was recently incarcerated and released on December 3rd. He is enjoying being home with his 5 children.   ROS: All other 10 point review of systems is negative.   PAST MEDICAL  HISTORY:   Past Medical History:  Diagnosis Date  . Acute ITP ([place]) 2007  . Leukemia, acute ([place]) 2007   splenectomy    ALLERGIES: No Known Allergies    MEDICATIONS:  Current Outpatient Medications on File Prior to Visit  Medication Sig Dispense Refill  . ibuprofen (ADVIL) 200 MG tablet Take 400 mg by mouth every 6 (six) hours as needed for moderate pain.     No current facility-administered medications on file prior to visit.     PAST SURGICAL HISTORY Past Surgical History:  Procedure Laterality Date  . ANKLE ARTHROPLASTY Left 2009  . APPENDECTOMY    . SPLENECTOMY, TOTAL      FAMILY HISTORY: No family history on file.  SOCIAL HISTORY:  reports that he quit smoking about 2 months ago. He has a 22.50 pack-year smoking history. He has never used smokeless tobacco. He reports current alcohol use. He reports current drug use. Drug: Marijuana.  PERFORMANCE STATUS: The patient's performance status is 1 - Symptomatic but completely ambulatory  PHYSICAL EXAM: Most Recent Vital Signs: Blood pressure 111/83, pulse 65, temperature (!) 97.1 F (36.2 C), temperature source Temporal, resp. rate 17, height 5' 11" (1.803 m), weight 156 lb (70.8 kg), SpO2 100 %. BP 111/83 (BP Location: Left Arm)   Pulse 65   Temp (!) 97.1 F (36.2 C) (Temporal)   Resp 17   Ht 5' 11" (1.803  m)   Wt 156 lb (70.8 kg)   SpO2 100%   BMI 21.76 kg/m   General Appearance:    Alert, cooperative, no distress, appears stated age  Head:    Normocephalic, without obvious abnormality, atraumatic  Eyes:    PERRL, conjunctiva/corneas clear, EOM's intact, fundi    benign, both eyes             Throat:   Lips, mucosa, and tongue normal; teeth and gums normal  Neck:   Supple, symmetrical, trachea midline, no adenopathy;       thyroid:  No enlargement/tenderness/nodules; no carotid   bruit or JVD  Back:     Symmetric, no curvature, ROM normal, no CVA tenderness  Lungs:     Clear to auscultation bilaterally,  respirations unlabored  Chest wall:    No tenderness or deformity  Heart:    Regular rate and rhythm, S1 and S2 normal, no murmur, rub   or gallop  Abdomen:     Soft, non-tender, bowel sounds active all four quadrants,    no masses, no organomegaly        Extremities:   Extremities normal, atraumatic, no cyanosis or edema  Pulses:   2+ and symmetric all extremities  Skin:   Skin color, texture, turgor normal, no rashes or lesions  Lymph nodes:   Cervical, supraclavicular, and axillary nodes normal  Neurologic:   CNII-XII intact. Normal strength, sensation and reflexes      throughout    LABORATORY DATA:  Results for orders placed or performed in visit on 08/27/19 (from the past 48 hour(s))  CBC with Differential ([place] Only)     Status: Abnormal   Collection Time: 08/27/19 11:24 AM  Result Value Ref Range   WBC Count 6.5 4.0 - 10.5 K/uL   RBC 4.39 4.22 - 5.81 MIL/uL   Hemoglobin 14.2 13.0 - 17.0 g/dL   HCT 42.4 39.0 - 52.0 %   MCV 96.6 80.0 - 100.0 fL   MCH 32.3 26.0 - 34.0 pg   MCHC 33.5 30.0 - 36.0 g/dL   RDW 12.2 11.5 - 15.5 %   Platelet Count 59 (L) 150 - 400 K/uL    Comment: EDTA platelet count consistent with citrate. Immature Platelet Fraction may be clinically indicated, consider ordering this additional test [id number]    nRBC 0.0 0.0 - 0.2 %   Neutrophils Relative % 43 %   Neutro Abs 2.8 1.7 - 7.7 K/uL   Lymphocytes Relative 40 %   Lymphs Abs 2.6 0.7 - 4.0 K/uL   Monocytes Relative 14 %   Monocytes Absolute 0.9 0.1 - 1.0 K/uL   Eosinophils Relative 2 %   Eosinophils Absolute 0.1 0.0 - 0.5 K/uL   Basophils Relative 1 %   Basophils Absolute 0.0 0.0 - 0.1 K/uL   Immature Granulocytes 0 %   Abs Immature Granulocytes 0.01 0.00 - 0.07 K/uL    Comment: Performed at [facility] at [facility], [address], [place], [place] [address]  [facility])     Status: None   Collection Time: 08/27/19 11:24 AM  Result Value Ref Range    Smear Review SMEAR STAINED AND AVAILABLE FOR REVIEW     Comment: Performed at [facility] at [facility], [address] [place], [place] [address]  Platelet by [facility]     Status: None   Collection Time: 08/27/19 11:24 AM  Result Value Ref  Range   Platelet CT in [facility] consistent with PLTS     Comment: Performed at [facility] at [facility], [address], [place], [place] [address]      RADIOGRAPHY: No results found.     PATHOLOGY: None   ASSESSMENT/PLAN: Mr. Begley is a pleasant 28 yo African American gentleman with history of ITP since childhood. A recent bout with Hepatitis A appears to have exacerbated his ITP.  His platelet count today is up to 59. He completed a week of prednisone last week.  He is asymptomatic at this time and has no complaints.  We will continues to follow along with him and platelet count < 30 we will have him do Decadron 40 mg PO daily for 4 days.  We will plan to see him back in another 6 months.  We discussed symptoms such as bleeding, bruising or persistent headaches that he should be on the look out for that could indicate a drop in his platelets. .   All questions were answered and he is in agreement with the plan. He will contact our office with any questions or concerns. We can certainly see him sooner if needed.   He was discussed with and also seen by Dr. Maylon Peppers and he is in agreement with the aforementioned.   Laverna Peace, NP     Addendum:  I interviewed and examined the patient.  I reviewed the patient's records extensively as well as NP Anjoli Diemer's note, and agree with the plan as detailed.   Patient was recently admitted for severe acute hepatitis secondary to acute hepatitis A infection.  CBC at that time was notable for severe thrombocytopenia (plts in the 10k's).  He was given prednisone 60 mg daily x7 days.  Review of the patient's CBCs dating back to 2030 showed  chronic thrombocytopenia with platelet count in the 70-80k's.  Patient was followed by pediatric hematology at [facility] in the 2000's, but unfortunately, we were unable to access those records.  According to the patient, he had a splenectomy due to chronic ITP.  He denies any symptoms of abnormal bleeding or excessive bruising.  CBC was notable for plts of 59k, which is improving since the recent hospitalization.  WBC and hemoglobin were normal. I personally reviewed the patient's peripheral blood smear today.  The red blood cells were of normal morphology.  There was no schistocytosis.  The white blood cells were of normal morphology. There were no peripheral circulating blasts. The platelets were relatively normal in size, with some immature platelets.  There was scattered platelet clumping.    Due to the platelet clumping on the peripheral blood smear, the platelet count was re-run with fluorescence, and showed platelet count of 59k.   I discussed with the patient some of the potential causes of chronic thrombocytopenia, including underlying liver disease, recent acute Hepatitis A infection, medications, and ITP.  As his platelet count is improving since the recent hospitalization, it is consistent with acute liver injury causing the drop in platelet count.  While he has a history of chronic ITP, we generally do not recommend initiating empiric steroid treatment unless platelet count falls below 30k.  The preferred steroid regimen, if needed, is dexamethasone 40 mg daily x4 days.  I counseled the patient on some of the concerning symptoms, including but not limited to, gingival bleeding, hematemesis, hemoptysis, hematochezia, melena, hematuria, or unexplained excessive bruising, for which he should seek care promptly.  While his  risk of severe bleeding is relatively small with daily ADLs, if he participates in high risk activities, such as contact sports, his risk of bleeding will be higher.  We will  plan to see the patient back in 6 months to repeat CBC and monitor his platelet count.  Tish Men, MD 08/27/2019 1:35 PM

## 2020-02-24 ENCOUNTER — Ambulatory Visit: Payer: Self-pay | Admitting: Hematology

## 2020-02-24 ENCOUNTER — Inpatient Hospital Stay: Payer: Self-pay | Admitting: Hematology & Oncology

## 2020-02-24 ENCOUNTER — Other Ambulatory Visit: Payer: Self-pay

## 2020-02-24 ENCOUNTER — Inpatient Hospital Stay: Payer: Self-pay | Attending: Hematology & Oncology

## 2021-01-18 IMAGING — US US ABDOMEN LIMITED
1 series · 14 of 25 positions shown · non-contrast
Comparison: None.

CLINICAL DATA: Elevated LFTs

EXAM:
ULTRASOUND ABDOMEN LIMITED RIGHT UPPER QUADRANT

[Series 1: us abdomen limited · 50 acquisitions, 14 frames shown]
[im 1/50]
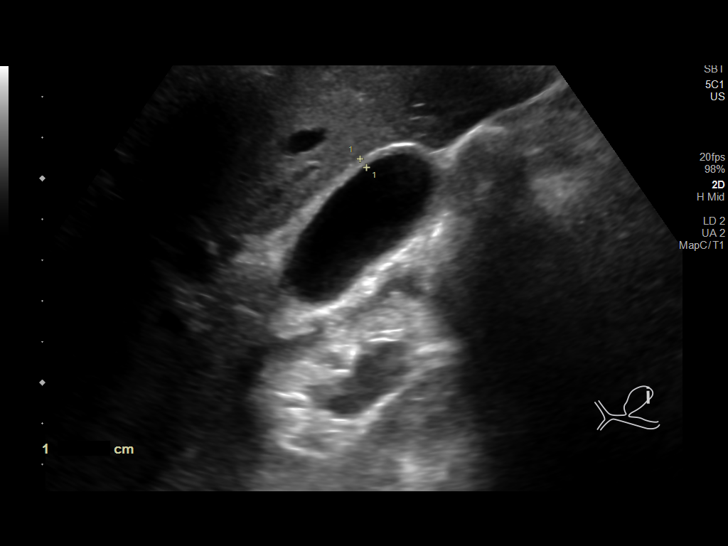
[im 5/50]
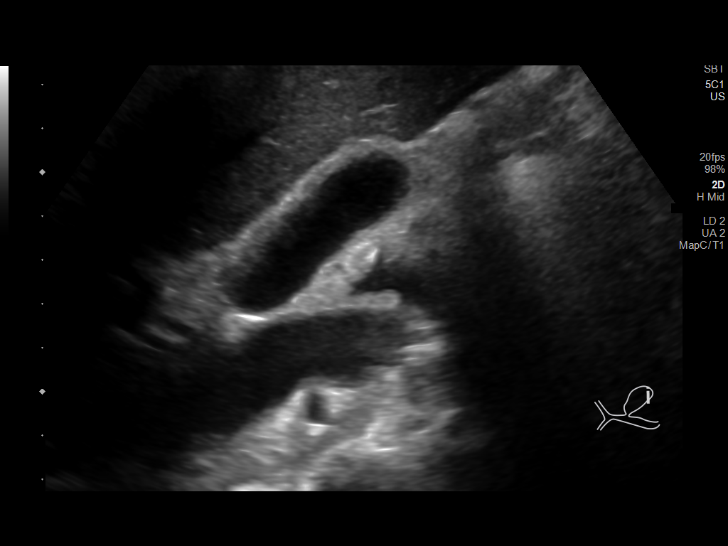
[im 9/50]
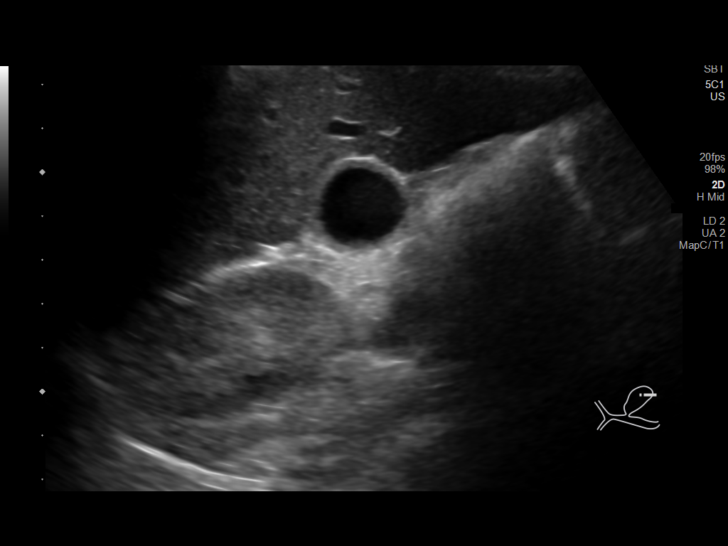
[im 13/50]
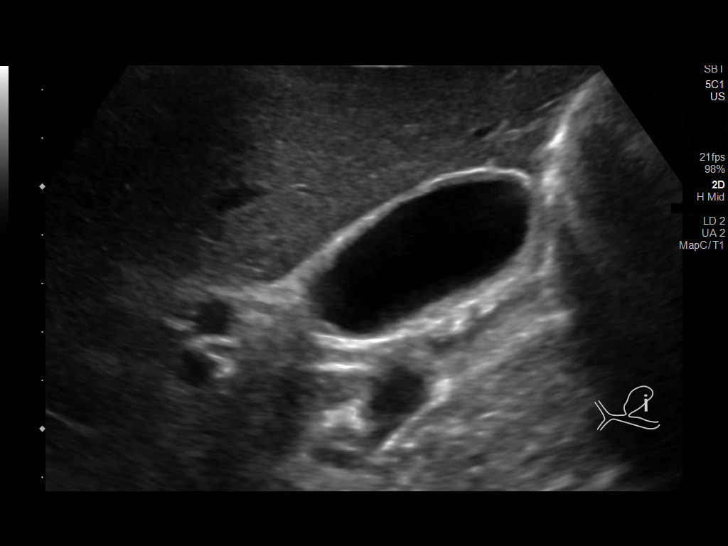
[im 17/50]
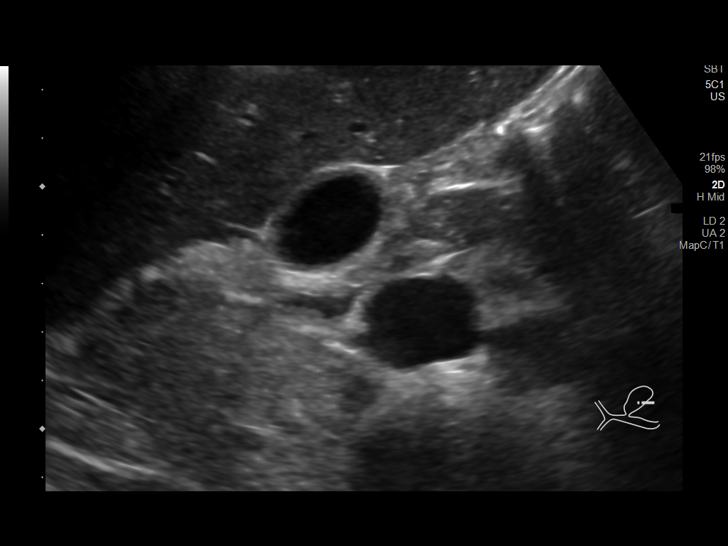
[im 19/50]
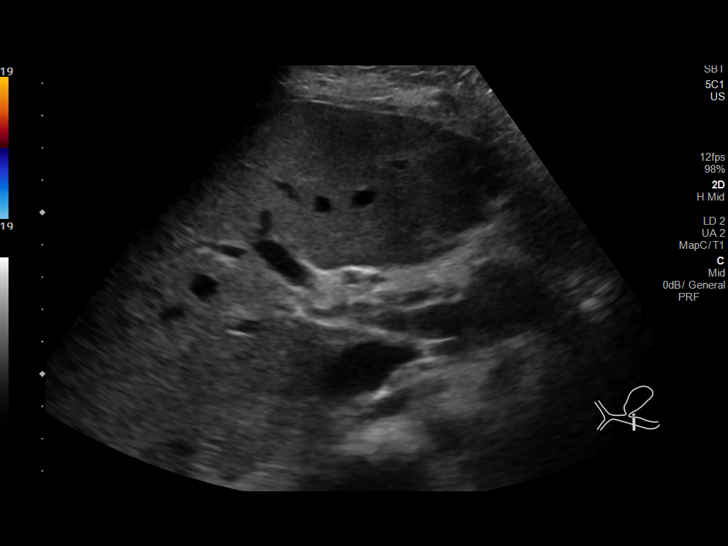
[im 23/50]
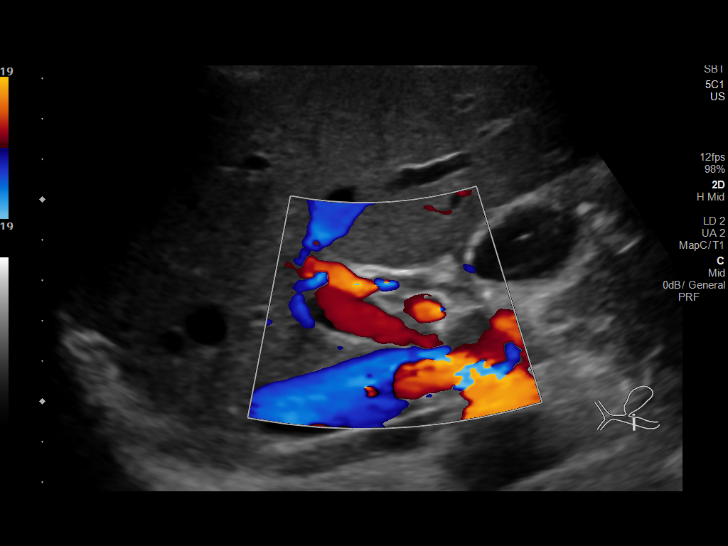
[im 27/50]
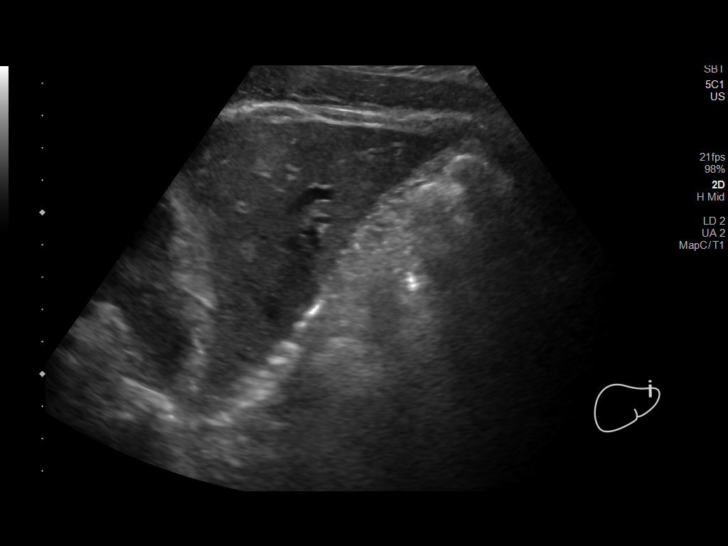
[im 31/50]
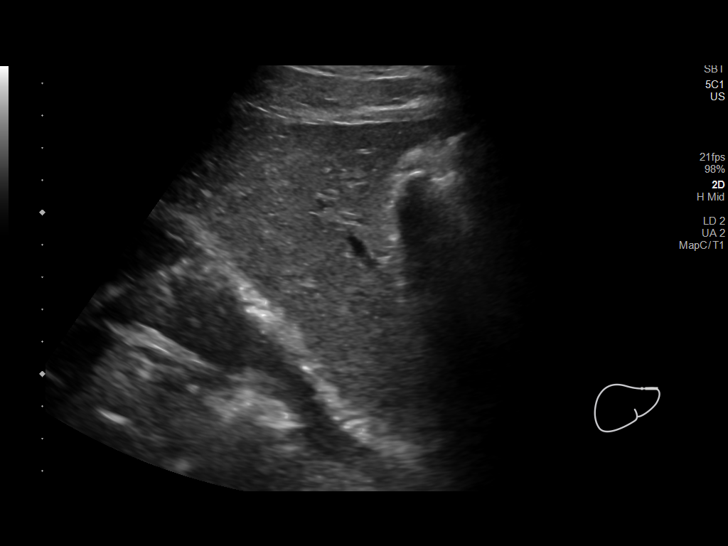
[im 33/50]
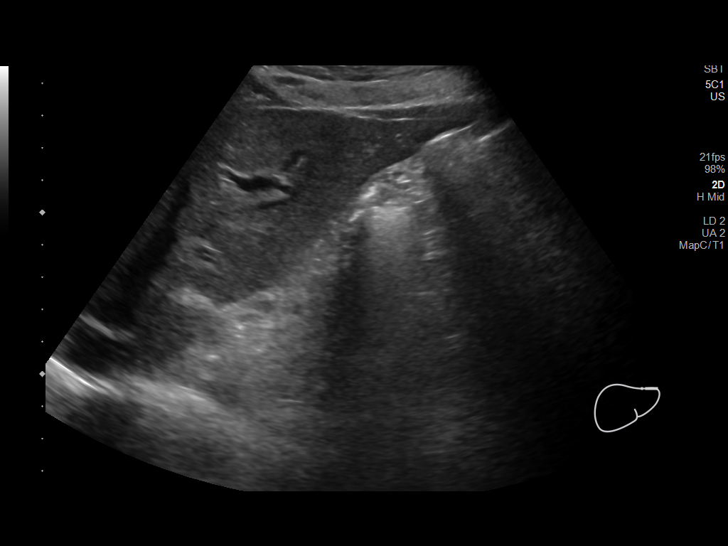
[im 37/50]
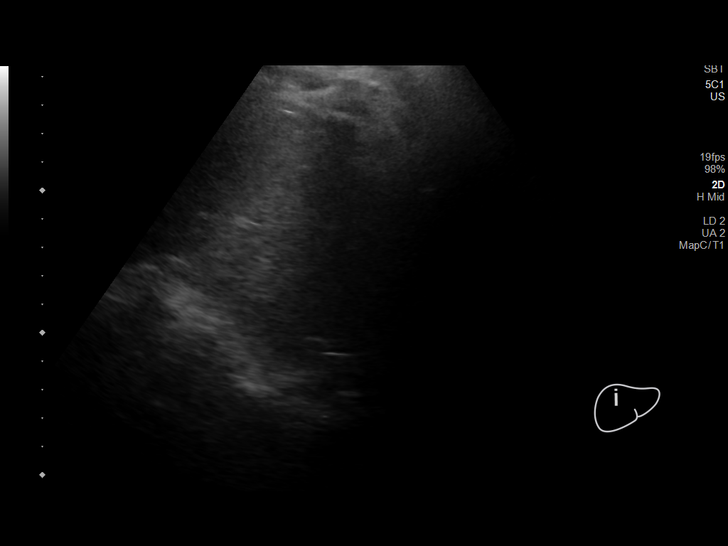
[im 41/50]
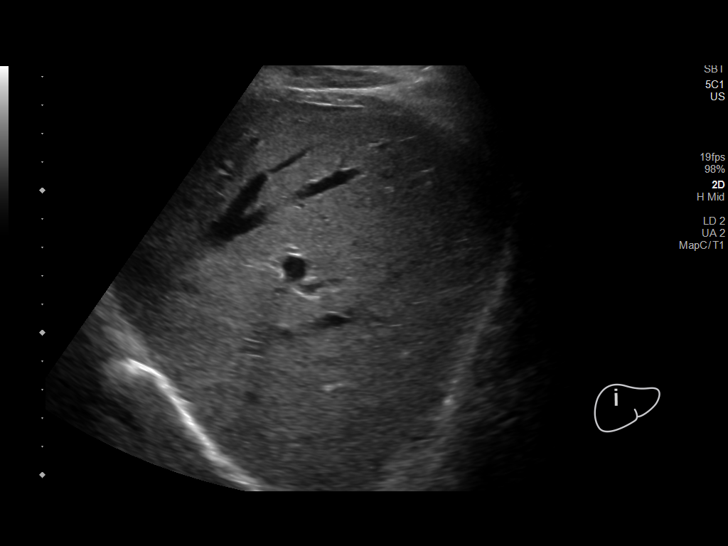
[im 45/50]
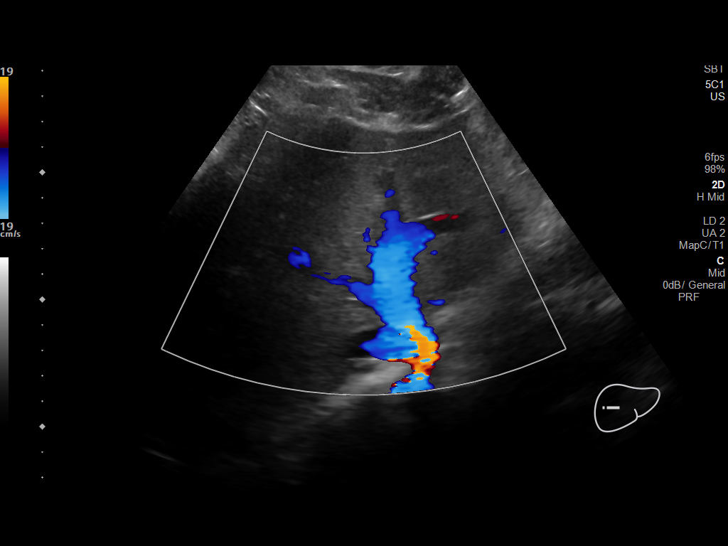
[im 50/50]
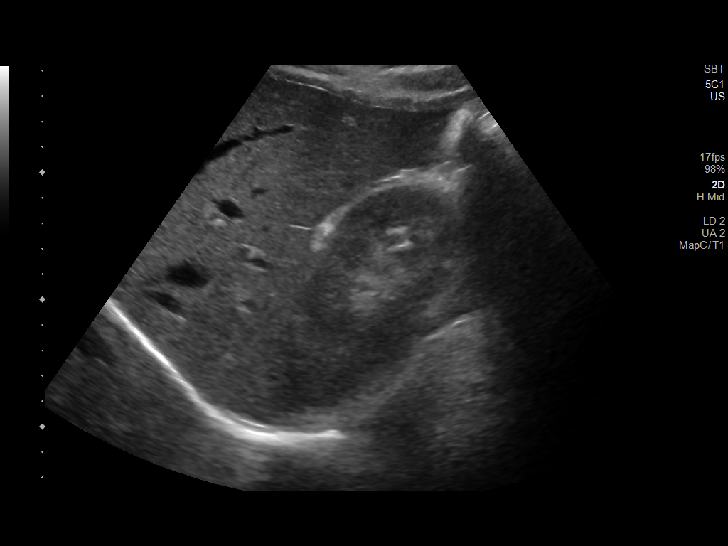

[14 of 25 positions shown; findings below may reference images not displayed]

FINDINGS: Gallbladder:

No gallstones or wall thickening visualized. No sonographic [place]
sign noted by sonographer.

Common bile duct:

Diameter: 2.6 mm

Liver:

No focal lesion identified. Within normal limits in parenchymal
echogenicity. Portal vein is patent on color Doppler imaging with
normal direction of blood flow towards the liver.

Other: Small 5 mm porta hepatis lymph node is noted.
IMPRESSION: No acute abnormality noted.

## 2023-07-28 ENCOUNTER — Ambulatory Visit (INDEPENDENT_AMBULATORY_CARE_PROVIDER_SITE_OTHER): Payer: Medicaid Other

## 2023-07-28 ENCOUNTER — Ambulatory Visit (HOSPITAL_COMMUNITY)
Admission: EM | Admit: 2023-07-28 | Discharge: 2023-07-28 | Disposition: A | Discharge status: Home / Self Care | Payer: Medicaid Other | Attending: Emergency Medicine | Admitting: Emergency Medicine

## 2023-07-28 ENCOUNTER — Encounter (HOSPITAL_COMMUNITY): Payer: Self-pay

## 2023-07-28 DIAGNOSIS — M25572 Pain in left ankle and joints of left foot: Secondary | ICD-10-CM

## 2023-07-28 NOTE — ED Triage Notes (Addendum)
 Pt presents with left foot pain x 1 day. Pt states I had a screw placed in it about ten years ago. I do not know how else to explain it but it feels like the screw is trying to come out. Pt currently rates his left foot pain an 8/10, worse when bearing weight. Pt denies taking medications for his pain. Pt is ambulatory to triage room.

## 2023-07-28 NOTE — Discharge Instructions (Addendum)
 I do not see any obviously displaced screws.  Please follow-up with orthopedic for further evaluation of your ankle pain, as they may be able to do revision surgeries to make you more comfortable.  You can alternate between 600 mg of ibuprofen and 500 mg of Tylenol every 4-6 hours for pain and inflammation, elevating your foot and staying off of it may help as well. Sometimes cold weather can make the pain worse.   Return to clinic for new or urgent symptoms.

## 2023-07-28 NOTE — ED Provider Notes (Signed)
 [facility]    CSN: [id number] Arrival date & time: 07/28/23  0825      History   Chief Complaint Chief Complaint  Patient presents with   Foot Pain    HPI Todd Marshall is a 33 y.o. male.   Patient presents to clinic for left ankle pain that started yesterday.  It is tolerable while at rest and gets worse with weightbearing.  He had ankle surgery around 10 years ago where he thinks he had a plate and 6 screws placed.  Feels like one of his screws is loose.  He has not had any recent injuries, trauma or falls.  Has not tried any interventions for his pain.  Ambulatory.  Does not have an established orthopedic.  The history is provided by the patient and medical records.  Foot Pain    Past Medical History:  Diagnosis Date   Acute ITP (HCC) 2007   Leukemia, acute (HCC) 2007   splenectomy    Patient Active Problem List   Diagnosis Date Noted   Hepatitis A infection 08/02/2019   Marijuana dependence (HCC) 08/02/2019   Tobacco dependence 08/02/2019   History of incarceration 08/02/2019   Asplenia 08/02/2019    Past Surgical History:  Procedure Laterality Date   ANKLE ARTHROPLASTY Left 2009   APPENDECTOMY     SPLENECTOMY, TOTAL         Home Medications    Prior to Admission medications   Medication Sig Start Date End Date Taking? Authorizing Provider  ibuprofen (ADVIL) 200 MG tablet Take 400 mg by mouth every 6 (six) hours as needed for moderate pain.    [provider]    Family History History reviewed. No pertinent family history.  Social History Social History   Tobacco Use   Smoking status: Every Day    Current packs/day: 0.00    Average packs/day: 1.5 packs/day for 15.0 years (22.5 ttl pk-yrs)    Types: Cigarettes    Start date: 06/24/2004    Last attempt to quit: 06/25/2019    Years since quitting: 4.0   Smokeless tobacco: Never  Vaping Use   Vaping status: Every Day  Substance Use Topics   Alcohol use: Yes     Comment: little use   Drug use: Yes    Types: Marijuana    Comment: daily use     Allergies   Patient has no known allergies.   Review of Systems Review of Systems  Per HPI   Physical Exam Triage Vital Signs ED Triage Vitals  Encounter Vitals Group     BP 07/28/23 0846 124/77     Systolic BP Percentile --      Diastolic BP Percentile --      Pulse Rate 07/28/23 0846 61     Resp 07/28/23 0846 17     Temp 07/28/23 0846 98.3 F (36.8 C)     Temp Source 07/28/23 0846 Oral     SpO2 07/28/23 0846 97 %     Weight 07/28/23 0844 195 lb (88.5 kg)     Height 07/28/23 0844 5' 11 (1.803 m)     Head Circumference --      Peak Flow --      Pain Score 07/28/23 0843 8     Pain Loc --      Pain Education --      Exclude from Growth Chart --    No data found.  Updated Vital Signs BP 124/77 (BP Location: Left Arm)  Pulse 61   Temp 98.3 F (36.8 C) (Oral)   Resp 17   Ht 5' 11 (1.803 m)   Wt 195 lb (88.5 kg)   SpO2 97%   BMI 27.20 kg/m   Visual Acuity Right Eye Distance:   Left Eye Distance:   Bilateral Distance:    Right Eye Near:   Left Eye Near:    Bilateral Near:     Physical Exam Vitals and nursing note reviewed.  Constitutional:      Appearance: Normal appearance.  HENT:     Head: Normocephalic and atraumatic.     Right Ear: External ear normal.     Left Ear: External ear normal.     Nose: Nose normal.     Mouth/Throat:     Mouth: Mucous membranes are moist.  Cardiovascular:     Rate and Rhythm: Normal rate.     Pulses:          Dorsalis pedis pulses are 2+ on the left side.       Posterior tibial pulses are 2+ on the left side.  Pulmonary:     Effort: Pulmonary effort is normal. No respiratory distress.  Musculoskeletal:        General: Tenderness present. Normal range of motion.       Feet:  Feet:     Left foot:     Skin integrity: Skin integrity normal.     Comments: Left anterior / medial ankle TTP, palpable screw. Surgical scars and  chronic swelling of left ankle. Pedal pulses 2+ with brisk capillary refill.  Skin:    General: Skin is warm and dry.  Neurological:     General: No focal deficit present.     Mental Status: He is alert and oriented to person, place, and time.  Psychiatric:        Mood and Affect: Mood normal.        Behavior: Behavior normal. Behavior is cooperative.      UC Treatments / Results  Labs (all labs ordered are listed, but only abnormal results are displayed) Labs Reviewed - No data to display  EKG   Radiology No results found.  Procedures Procedures (including critical care time)  Medications Ordered in UC Medications - No data to display  Initial Impression / Assessment and Plan / UC Course  I have reviewed the triage vital signs and the nursing notes.  Pertinent labs & imaging results that were available during my care of the patient were reviewed by me and considered in my medical decision making (see chart for details).  Vitals and triage reviewed, patient is hemodynamically stable.  Previous ankle revision and surgery with a plate and screws.  Palpable screw in left anterior medial ankle.  Imaging shows no obvious displacement by my interpretation, screw anchoring tibia to fibula does appear to be protruding. Pain management discussed, orthopedic follow-up encouraged.      Final Clinical Impressions(s) / UC Diagnoses   Final diagnoses:  Acute left ankle pain     Discharge Instructions      I do not see any obviously displaced screws.  Please follow-up with orthopedic for further evaluation of your ankle pain, as they may be able to do revision surgeries to make you more comfortable.  You can alternate between 600 mg of ibuprofen and 500 mg of Tylenol every 4-6 hours for pain and inflammation, elevating your foot and staying off of it may help as well. Sometimes cold weather can make the  pain worse.   Return to clinic for new or urgent symptoms.      ED  Prescriptions   None    PDMP not reviewed this encounter.   Dreama, Velina Drollinger  N, FNP 07/28/23 [phone]

## 2023-08-10 ENCOUNTER — Ambulatory Visit (INDEPENDENT_AMBULATORY_CARE_PROVIDER_SITE_OTHER): Payer: Medicaid Other | Admitting: Orthopedic Surgery

## 2023-08-10 DIAGNOSIS — T85848A Pain due to other internal prosthetic devices, implants and grafts, initial encounter: Secondary | ICD-10-CM

## 2023-08-21 ENCOUNTER — Encounter: Payer: Self-pay | Admitting: Orthopedic Surgery

## 2023-08-21 NOTE — Progress Notes (Signed)
Office Visit Note   Patient: Todd Marshall           Date of Birth: April 23, 1991           MRN: [id number] Visit Date: 08/10/2023              Requested by: No referring provider defined for this encounter. PCP: Patient, No Pcp Per  Chief Complaint  Patient presents with   Left Ankle - Numbness    Feels like screw is coming out   Right Foot - Wound Check      HPI: Patient is a 33 year old gentleman who was seen for initial evaluation for left ankle pain.  Patient states he underwent open reduction internal fixation about 10 years ago with a lateral plate and screws and complains of pain medially.  Assessment & Plan: Visit Diagnoses: No diagnosis found.  Plan: Patient would like to proceed with removal of the syndesmotic screw that is prominent medially.  Will plan for medial and lateral incisions may need to use a trephine around the screw for removal.  Risk and benefits were discussed including infection persistent pain need for additional surgery.  Patient states he understands wished to proceed at this time.  Follow-Up Instructions: No follow-ups on file.   Ortho Exam  Patient is alert, oriented, no adenopathy, well-dressed, normal affect, normal respiratory effort. Examination patient has a good dorsalis pedis pulse there is no pain with range of motion the ankle.  There is no cellulitis medially around the ankle.  Radiograph shows a congruent mortise however the screw is prominent medially from the syndesmotic fixation.  There is calcification across the syndesmosis.  Patient is only symptomatic over the prominent medial screw.  Imaging: No results found. No images are attached to the encounter.  Labs: Lab Results  Component Value Date   REPTSTATUS 08/03/2019 FINAL 08/01/2019   CULT (A) 08/01/2019    <10,000 COLONIES/mL INSIGNIFICANT GROWTH Performed at [facility], [address], [place], [place] [address]      Lab Results  Component Value Date    ALBUMIN 4.1 08/27/2019   ALBUMIN 2.9 (L) 08/06/2019   ALBUMIN 2.9 (L) 08/05/2019    Lab Results  Component Value Date   MG 1.8 08/04/2019   No results found for: "VD25OH"  No results found for: "PREALBUMIN"    Latest Ref Rng & Units 08/27/2019   11:24 AM 08/06/2019    2:23 PM 08/06/2019    5:08 AM  CBC EXTENDED  WBC 4.0 - 10.5 K/uL 6.5   17.0   RBC 4.22 - 5.81 MIL/uL 4.39   4.03   Hemoglobin 13.0 - 17.0 g/dL 62.1   30.8   HCT 65.7 - 52.0 % 42.4   38.4   Platelets 150 - 400 K/uL 59  69  PLATELET CLUMPS NOTED ON SMEAR, COUNT APPEARS DECREASED   NEUT# 1.7 - 7.7 K/uL 2.8   9.6   Lymph# 0.7 - 4.0 K/uL 2.6   4.8      There is no height or weight on file to calculate BMI.  Orders:  No orders of the defined types were placed in this encounter.  No orders of the defined types were placed in this encounter.    Procedures: No procedures performed  Clinical Data: No additional findings.  ROS:  All other systems negative, except as noted in the HPI. Review of Systems  Objective: Vital Signs: There were no vitals taken for this visit.  Specialty Comments:  No  specialty comments available.  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 No pertinent family history.  Past Surgical History:  Procedure Laterality Date   ANKLE ARTHROPLASTY Left 2009   APPENDECTOMY     SPLENECTOMY, TOTAL     Social History   Occupational History   Occupation: unemployed  Tobacco Use   Smoking status: Every Day    Current packs/day: 0.00    Average packs/day: 1.5 packs/day for 15.0 years (22.5 ttl pk-yrs)    Types: Cigarettes    Start date: 06/24/2004    Last attempt  to quit: 06/25/2019    Years since quitting: 4.1   Smokeless tobacco: Never  Vaping Use   Vaping status: Every Day  Substance and Sexual Activity   Alcohol use: Yes    Comment: little use   Drug use: Yes    Types: Marijuana    Comment: daily use   Sexual activity: Not on file

## 2023-09-05 ENCOUNTER — Telehealth: Payer: Self-pay | Admitting: Orthopedic Surgery

## 2023-09-05 NOTE — Telephone Encounter (Signed)
I have a surgery order to schedule Mr. Todd Marshall for surgery on his left ankle.  I called and left a message on his voicemail for him to please return my call to discuss possible dates for surgery.
